# Patient Record
Sex: Male | Born: 1965 | Race: White | Hispanic: No | Marital: Married | Smoking: Never smoker
Health system: Southern US, Community
[De-identification: ages and names within clinical notes are randomized; demographics above are authoritative.]

## PROBLEM LIST (undated history)

## (undated) DIAGNOSIS — E559 Vitamin D deficiency, unspecified: Secondary | ICD-10-CM

## (undated) DIAGNOSIS — R7303 Prediabetes: Secondary | ICD-10-CM

## (undated) DIAGNOSIS — F419 Anxiety disorder, unspecified: Secondary | ICD-10-CM

## (undated) DIAGNOSIS — E785 Hyperlipidemia, unspecified: Secondary | ICD-10-CM

## (undated) DIAGNOSIS — N529 Male erectile dysfunction, unspecified: Secondary | ICD-10-CM

---

## 2013-10-19 ENCOUNTER — Inpatient Hospital Stay: Payer: Self-pay | Admitting: Internal Medicine

## 2013-10-19 LAB — URINALYSIS, COMPLETE
Bacteria: NONE SEEN
Bilirubin,UR: NEGATIVE
Blood: NEGATIVE
GLUCOSE, UR: NEGATIVE mg/dL (ref 0–75)
LEUKOCYTE ESTERASE: NEGATIVE
NITRITE: NEGATIVE
PH: 5 (ref 4.5–8.0)
Protein: NEGATIVE
RBC,UR: <1 /HPF (ref 0–5)
Specific Gravity: 1.012 (ref 1.003–1.030)
Squamous Epithelial: NONE SEEN
WBC UR: NONE SEEN /HPF (ref 0–5)

## 2013-10-19 LAB — TROPONIN I
TROPONIN-I: 1.1 ng/mL — AB
Troponin-I: 2 ng/mL — ABNORMAL HIGH
Troponin-I: 5.5 ng/mL — ABNORMAL HIGH

## 2013-10-19 LAB — CK-MB
CK-MB: 8 ng/mL — ABNORMAL HIGH (ref 0.5–3.6)
CK-MB: 16.4 ng/mL — ABNORMAL HIGH (ref 0.5–3.6)
CK-MB: 40.7 ng/mL — AB (ref 0.5–3.6)

## 2013-10-19 LAB — CBC WITH DIFFERENTIAL/PLATELET
Basophil #: 0 10*3/uL (ref 0.0–0.1)
Basophil %: 0.4 %
Eosinophil #: 0.1 10*3/uL (ref 0.0–0.7)
Eosinophil %: 1.2 %
HCT: 48.9 % (ref 40.0–52.0)
HGB: 16.4 g/dL (ref 13.0–18.0)
Lymphocyte #: 2.8 10*3/uL (ref 1.0–3.6)
Lymphocyte %: 30.3 %
MCH: 29.6 pg (ref 26.0–34.0)
MCHC: 33.6 g/dL (ref 32.0–36.0)
MCV: 88 fL (ref 80–100)
MONO ABS: 1 x10 3/mm (ref 0.2–1.0)
MONOS PCT: 10.4 %
Neutrophil #: 5.4 10*3/uL (ref 1.4–6.5)
Neutrophil %: 57.7 %
Platelet: 233 10*3/uL (ref 150–440)
RBC: 5.55 10*6/uL (ref 4.40–5.90)
RDW: 13.6 % (ref 11.5–14.5)
WBC: 9.3 10*3/uL (ref 3.8–10.6)

## 2013-10-19 LAB — APTT
Activated PTT: 32.6 secs (ref 23.6–35.9)
Activated PTT: 67.7 secs — ABNORMAL HIGH (ref 23.6–35.9)

## 2013-10-19 LAB — BASIC METABOLIC PANEL
Anion Gap: 6 — ABNORMAL LOW (ref 7–16)
BUN: 17 mg/dL (ref 7–18)
CALCIUM: 9.6 mg/dL (ref 8.5–10.1)
Chloride: 106 mmol/L (ref 98–107)
Co2: 26 mmol/L (ref 21–32)
Creatinine: 0.76 mg/dL (ref 0.60–1.30)
EGFR (African American): >60
EGFR (Non-African Amer.): >60
Glucose: 123 mg/dL — ABNORMAL HIGH (ref 65–99)
Osmolality: 279 (ref 275–301)
POTASSIUM: 3.9 mmol/L (ref 3.5–5.1)
Sodium: 138 mmol/L (ref 136–145)

## 2013-10-19 LAB — HEMOGLOBIN A1C: Hemoglobin A1C: 6 % (ref 4.2–6.3)

## 2013-10-19 LAB — LIPID PANEL
Cholesterol: 176 mg/dL (ref 0–200)
HDL Cholesterol: 54 mg/dL (ref 40–60)
Ldl Cholesterol, Calc: 92 mg/dL (ref 0–100)
Triglycerides: 151 mg/dL (ref 0–200)
VLDL Cholesterol, Calc: 30 mg/dL (ref 5–40)

## 2013-10-19 LAB — PROTIME-INR
INR: 0.9
Prothrombin Time: 11.9 secs (ref 11.5–14.7)

## 2013-10-20 ENCOUNTER — Encounter (HOSPITAL_COMMUNITY): Payer: Self-pay | Admitting: Physician Assistant

## 2013-10-20 ENCOUNTER — Inpatient Hospital Stay (HOSPITAL_COMMUNITY)
Admission: AD | Admit: 2013-10-20 | Discharge: 2013-10-27 | DRG: 236 | Disposition: A | Discharge status: Home / Self Care | Payer: 59 | Source: Other Acute Inpatient Hospital | Attending: Surgery | Admitting: Surgery

## 2013-10-20 ENCOUNTER — Other Ambulatory Visit: Payer: Self-pay | Admitting: *Deleted

## 2013-10-20 ENCOUNTER — Inpatient Hospital Stay (HOSPITAL_COMMUNITY): Payer: 59

## 2013-10-20 DIAGNOSIS — Z951 Presence of aortocoronary bypass graft: Secondary | ICD-10-CM

## 2013-10-20 DIAGNOSIS — I2510 Atherosclerotic heart disease of native coronary artery without angina pectoris: Secondary | ICD-10-CM

## 2013-10-20 DIAGNOSIS — D62 Acute posthemorrhagic anemia: Secondary | ICD-10-CM | POA: Diagnosis not present

## 2013-10-20 DIAGNOSIS — R079 Chest pain, unspecified: Secondary | ICD-10-CM

## 2013-10-20 DIAGNOSIS — E8779 Other fluid overload: Secondary | ICD-10-CM | POA: Diagnosis not present

## 2013-10-20 DIAGNOSIS — D696 Thrombocytopenia, unspecified: Secondary | ICD-10-CM | POA: Diagnosis not present

## 2013-10-20 DIAGNOSIS — I214 Non-ST elevation (NSTEMI) myocardial infarction: Principal | ICD-10-CM | POA: Diagnosis present

## 2013-10-20 DIAGNOSIS — E785 Hyperlipidemia, unspecified: Secondary | ICD-10-CM | POA: Diagnosis present

## 2013-10-20 HISTORY — DX: Hyperlipidemia, unspecified: E78.5

## 2013-10-20 LAB — BASIC METABOLIC PANEL
Anion Gap: 6 — ABNORMAL LOW (ref 7–16)
BUN: 11 mg/dL (ref 7–18)
CALCIUM: 8.9 mg/dL (ref 8.5–10.1)
CREATININE: 0.95 mg/dL (ref 0.60–1.30)
Chloride: 107 mmol/L (ref 98–107)
Co2: 24 mmol/L (ref 21–32)
EGFR (Non-African Amer.): >60
Glucose: 102 mg/dL — ABNORMAL HIGH (ref 65–99)
Osmolality: 273 (ref 275–301)
Potassium: 3.7 mmol/L (ref 3.5–5.1)
SODIUM: 137 mmol/L (ref 136–145)

## 2013-10-20 LAB — CBC WITH DIFFERENTIAL/PLATELET
BASOS ABS: 0 10*3/uL (ref 0.0–0.1)
BASOS PCT: 0.4 %
Eosinophil #: 0.1 10*3/uL (ref 0.0–0.7)
Eosinophil %: 0.4 %
HCT: 45 % (ref 40.0–52.0)
HGB: 15.1 g/dL (ref 13.0–18.0)
LYMPHS ABS: 2.8 10*3/uL (ref 1.0–3.6)
LYMPHS PCT: 22.5 %
MCH: 29.3 pg (ref 26.0–34.0)
MCHC: 33.6 g/dL (ref 32.0–36.0)
MCV: 87 fL (ref 80–100)
MONOS PCT: 10.8 %
Monocyte #: 1.3 x10 3/mm — ABNORMAL HIGH (ref 0.2–1.0)
NEUTROS ABS: 8.1 10*3/uL — AB (ref 1.4–6.5)
Neutrophil %: 65.9 %
Platelet: 211 10*3/uL (ref 150–440)
RBC: 5.16 10*6/uL (ref 4.40–5.90)
RDW: 13.4 % (ref 11.5–14.5)
WBC: 12.4 10*3/uL — ABNORMAL HIGH (ref 3.8–10.6)

## 2013-10-20 LAB — MRSA PCR SCREENING: MRSA BY PCR: NEGATIVE

## 2013-10-20 LAB — URINALYSIS, ROUTINE W REFLEX MICROSCOPIC
BILIRUBIN URINE: NEGATIVE
Glucose, UA: NEGATIVE mg/dL
Hgb urine dipstick: NEGATIVE
KETONES UR: NEGATIVE mg/dL
Leukocytes, UA: NEGATIVE
NITRITE: NEGATIVE
Protein, ur: NEGATIVE mg/dL
Specific Gravity, Urine: 1.028 (ref 1.005–1.030)
Urobilinogen, UA: 1 mg/dL (ref 0.0–1.0)
pH: 7 (ref 5.0–8.0)

## 2013-10-20 LAB — LIPID PANEL
CHOL/HDL RATIO: 2.8 ratio
Cholesterol: 162 mg/dL (ref 0–200)
HDL: 57 mg/dL (ref >39)
LDL CALC: 82 mg/dL (ref 0–99)
Triglycerides: 117 mg/dL (ref <150)
VLDL: 23 mg/dL (ref 0–40)

## 2013-10-20 LAB — HEPARIN LEVEL (UNFRACTIONATED): Heparin Unfractionated: 0.44 IU/mL (ref 0.30–0.70)

## 2013-10-20 LAB — APTT: ACTIVATED PTT: 56.9 s — AB (ref 23.6–35.9)

## 2013-10-20 MED ORDER — SODIUM CHLORIDE 0.9 % IV SOLN
INTRAVENOUS | Status: DC
  Administered 2013-10-20: 16:00:00 via INTRAVENOUS

## 2013-10-20 MED ORDER — METOPROLOL TARTRATE 12.5 MG HALF TABLET
12.5000 mg | ORAL_TABLET | Freq: Two times a day (BID) | ORAL | Status: DC
  Administered 2013-10-20 – 2013-10-22 (×5): 12.5 mg via ORAL
  Filled 2013-10-20 (×6): qty 1

## 2013-10-20 MED ORDER — ALPRAZOLAM 0.5 MG PO TABS
0.5000 mg | ORAL_TABLET | Freq: Three times a day (TID) | ORAL | Status: DC | PRN
  Administered 2013-10-21 – 2013-10-22 (×5): 0.5 mg via ORAL
  Filled 2013-10-20 (×5): qty 1

## 2013-10-20 MED ORDER — HEPARIN (PORCINE) IN NACL 100-0.45 UNIT/ML-% IJ SOLN
1550.0000 [IU]/h | INTRAMUSCULAR | Status: DC
  Administered 2013-10-20 – 2013-10-22 (×4): 1550 [IU]/h via INTRAVENOUS
  Filled 2013-10-20 (×8): qty 250

## 2013-10-20 MED ORDER — NITROGLYCERIN 0.4 MG SL SUBL: 0.4000 mg | SUBLINGUAL_TABLET | SUBLINGUAL | Status: DC | PRN

## 2013-10-20 MED ORDER — ASPIRIN 325 MG PO TABS
325.0000 mg | ORAL_TABLET | Freq: Every day | ORAL | Status: DC
  Administered 2013-10-21: 325 mg via ORAL
  Filled 2013-10-20 (×2): qty 1

## 2013-10-20 MED ORDER — MORPHINE SULFATE 2 MG/ML IJ SOLN: 2.0000 mg | INTRAMUSCULAR | Status: DC | PRN

## 2013-10-20 MED ORDER — ATORVASTATIN CALCIUM 20 MG PO TABS
20.0000 mg | ORAL_TABLET | Freq: Every day | ORAL | Status: DC
  Administered 2013-10-20 – 2013-10-26 (×6): 20 mg via ORAL
  Filled 2013-10-20 (×8): qty 1

## 2013-10-20 MED ORDER — ACETAMINOPHEN 325 MG PO TABS
650.0000 mg | ORAL_TABLET | Freq: Four times a day (QID) | ORAL | Status: DC | PRN
  Administered 2013-10-20 – 2013-10-22 (×2): 650 mg via ORAL
  Filled 2013-10-20 (×2): qty 2

## 2013-10-20 MED ORDER — ONDANSETRON HCL 4 MG/2ML IJ SOLN: 4.0000 mg | Freq: Once | INTRAMUSCULAR | Status: DC

## 2013-10-20 NOTE — Progress Notes (Signed)
PA paged to make aware of pt's arrival to unit from [place]; Hep gtt infusing; pt denies pain; VSS; family in room; will cont. To monitor.

## 2013-10-20 NOTE — H&P (Signed)
Patrick Pope       [place] [address]             [phone]        Patrick Pope Medical Record #[id number] Date of Birth: 1966-02-13  Referring: Neoma Laming Primary Care: No PCP Per Patient  Chief Complaint:   NSTEMI, 3V CAD  History of Present Illness:      Patrick Pope is a 48 yo male with no previous history of CAD.  He has history of Hyperlipidemia for which he takes Lipitor.  The patient presented to an [facility] with a 2 complaint of feeling sick.  The patient stated that he just felt sick along his torso.  He originally thought he was suffering from indigestion, however the patient states it was more abdominal discomfort with radiation into his back.  The patient took Ibuprofen and Hydrocodone which was prescribed from a recent oral surgery which originally provided relief.  However on the morning of presentation he woke up and his feelings of discomfort had returned.  During this episode he did have some associated nasuea, but denied palpitations, diaphoresis and shortness of breath.  Hospital workup consisted of EKG with ST depression in lateral leads.  Initial Troponin was elevated at 1.1.  Due to this it was felt he should be admitted for further observation.  He was admitted to the hospitalist service.  His cardiac enzymes continued to rise and Cardiology consult was obtained.  He was later ruled in for acute MI and taken for cardiac catheterization.  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  It was felt Coronary Bypass Grafting would be the treatment of choice.  He was placed on a Heparin drip and transferred to [facility] for bypass surgery.  Currently, the patient is chest pain free.  The patient denies smoking history, but states his mother has significant heart problems in her 21s.  Current Activity/ Functional Status: Patient is independent with mobility/ambulation, transfers, ADL's, IADL's.   Zubrod Score: At the time of surgery this  patient's most appropriate activity status/level should be described as: [x]     0    Normal activity, no symptoms []     1    Restricted in physical strenuous activity but ambulatory, able to do out light work []     2    Ambulatory and capable of self care, unable to do work activities, up and about                 more than 50%  Of the time                            []     3    Only limited self care, in bed greater than 50% of waking hours []     4    Completely disabled, no self care, confined to bed or chair []     5    Moribund    History  Smoking status  . Never Smoker   Smokeless tobacco  . Never Used    History  Alcohol Use  . Yes   History   Social History  . Marital Status: Married    Spouse Name: N/A    Number of Children: N/A  . Years of Education: N/A   Occupational History  . Not on file.   Social History Main Topics  . Smoking status: Never Smoker   .  Smokeless tobacco: Never Used  . Alcohol Use: Yes  . Drug Use: No  . Sexual Activity: Not on file   Other Topics Concern  . Not on file   Social History Narrative  . No narrative on file    Current Facility-Administered Medications  Medication Dose Route Frequency Provider Last Rate Last Dose  . 0.9 %  sodium chloride infusion   Intravenous Continuous Erin Barrett, PA-C      . acetaminophen (TYLENOL) tablet 650 mg  650 mg Oral Q6H PRN Erin Barrett, PA-C      . aspirin tablet 325 mg  325 mg Oral Daily Erin Barrett, PA-C      . atorvastatin (LIPITOR) tablet 20 mg  20 mg Oral q1800 Erin Barrett, PA-C      . metoprolol tartrate (LOPRESSOR) tablet 12.5 mg  12.5 mg Oral BID Erin Barrett, PA-C      . morphine 2 MG/ML injection 2 mg  2 mg Intravenous Q1H PRN Erin Barrett, PA-C      . nitroGLYCERIN (NITROSTAT) SL tablet 0.4 mg  0.4 mg Sublingual Q5 min PRN Erin Barrett, PA-C      . ondansetron (ZOFRAN) injection 4 mg  4 mg Intravenous Once Erin Barrett, PA-C       Review of Systems:     Cardiac Review of  Systems: Y or N  Chest Pain [n    ]  Resting SOB [  n ] Exertional SOB  [  ]  Orthopnea [  ]   Pedal Edema [   ]    Palpitations [  ] Syncope  [  ]   Presyncope [   ]  General Review of Systems: [Y] = yes [  ]=no Constitional: recent weight change [  ]; anorexia [  ]; fatigue [  ]; nausea [ n ]; night sweats [  ]; fever [  ]; or chills [  ]                                                               Dental: poor dentition[  ]; Last Dentist visit:   Eye : blurred vision [  ]; diplopia [   ]; vision changes [  ];  Amaurosis fugax[  ]; Resp: cough [  ];  wheezing[ n ];  hemoptysis[  ]; shortness of breath[ n ]; paroxysmal nocturnal dyspnea[  ]; dyspnea on exertion[ n ]; or orthopnea[  ];  GI:  gallstones[  ], vomiting[  ];  dysphagia[  ]; melena[  ];  hematochezia [  ]; heartburn[n  ];   Hx of  Colonoscopy[  ]; GU: kidney stones [ n ]; hematuria[  ];   dysuria [  ];  nocturia[  ];  history of     obstruction [  ]; urinary frequency [  ]             Skin: rash, swelling[ n ];, hair loss[  ];  peripheral edema[ n ];  or itching[  ]; Musculosketetal: myalgias[  ];  joint swelling[  ];  joint erythema[  ];  joint pain[  ];  back pain[  ];  Heme/Lymph: bruising[  ];  bleeding[  ];  anemia[  ];  Neuro: TIA[  ];  headaches[  ];  stroke[  ];  vertigo[  ];  seizures[  ];   paresthesias[  ];  difficulty walking[  ];  Psych:depression[  ]; anxiety[  ];  Endocrine: diabetes[  n];  thyroid dysfunction[  ];  Immunizations: Flu [  ]; Pneumococcal[  ];  Other:  Physical Exam: BP 120/76  Pulse 70  Temp(Src) 97.9 F (36.6 C) (Oral)  Resp 16  SpO2 95%  General appearance: alert, cooperative and no distress Neurologic: intact Heart: regular rate and rhythm Lungs: clear to auscultation bilaterally Abdomen: soft, non-tender; bowel sounds normal; no masses,  no organomegaly Extremities: extremities normal, atraumatic, no cyanosis or edema   Assessment / Plan:      1. Admit to Inpatient 2. NSTEMI, CAD-  Continue Heparin gtt, ASA, Lopressor 3. Hyperlipidemia- continue Lipitor at home dose 4. Dispo- patient stable, chest pain free, timing of bypass per Dr. Cyndia Bent      @ME1 @ 10/20/2013 3:56 PM   Chart reviewed, cine reviewed in the cloud patient examined, agree with above. He has severe 3-vessel coronary disease with distal left main trifurcation stenosis extending out into the LAD and LCX. There is 95% proximal LAD and diagonal stenosis, 90% mid RCA stenosis. LV function looks good. I agree with the need for CABG. He received 300 mg loading dose of Plavix yesterday at [facility]. Will check his P2Y12 assay in the am and then decide on timing of surgery. He is pain-free on Heparin drip. I discussed the operative procedure with the patient and wife, mother including alternatives, benefits and risks; including but not limited to bleeding, blood transfusion, infection, stroke, myocardial infarction, graft failure, heart block requiring a permanent pacemaker, organ dysfunction, and death.  Patrick Pope understands and agrees to proceed.

## 2013-10-20 NOTE — Progress Notes (Signed)
ANTICOAGULATION CONSULT NOTE - Initial Consult  Pharmacy Consult for heparin Indication: chest pain/ACS  No Known Allergies  Patient Measurements: Height: 5\' 11"  (180.3 cm) Weight: 214 lb (97.07 kg) IBW/kg (Calculated) : 75.3 Heparin Dosing Weight: 97kg  Vital Signs: Temp: 97.9 F (36.6 C) (06/15 1445) Temp src: Oral (06/15 1445) BP: 120/76 mmHg (06/15 1445) Pulse Rate: 70 (06/15 1445)  Labs:  Recent Labs  10/20/13 1950  HEPARINUNFRC 0.44    CrCl is unknown because no creatinine reading has been taken.   Medical History: Past Medical History  Diagnosis Date  . Hyperlipidemia     Medications:  Prescriptions prior to admission  Medication Sig Dispense Refill  . atorvastatin (LIPITOR) 20 MG tablet Take 20 mg by mouth every evening.      Marland Kitchen ibuprofen (ADVIL,MOTRIN) 600 MG tablet Take 600 mg by mouth daily as needed.      . metoprolol tartrate (LOPRESSOR) 25 MG tablet Take 12.5 mg by mouth 2 (two) times daily.       Scheduled:  . aspirin  325 mg Oral Daily  . atorvastatin  20 mg Oral q1800  . metoprolol tartrate  12.5 mg Oral BID  . ondansetron (ZOFRAN) IV  4 mg Intravenous Once   Infusions:  . sodium chloride 10 mL/hr at 10/20/13 1558  . heparin 1,550 Units/hr (10/20/13 1608)    Assessment: Pt was tx from [place] for CP. It looks like he was started on IV heparin over there with a bolus and a rate around 1560 units/hr. He was then taken to cath and found to have 3VD so he was tx over here for OHS eval. Heparin to be cont here. Not sure what time heparin was started over there. Heparin level is therapeutic tonight.   Goal of Therapy:  Heparin level 0.3-0.7 units/ml Monitor platelets by anticoagulation protocol: Yes   Plan:   Cont heparin drip at 1550 units/hr Daily level and CBC

## 2013-10-20 NOTE — Progress Notes (Addendum)
ANTICOAGULATION CONSULT NOTE - Initial Consult  Pharmacy Consult for heparin Indication: chest pain/ACS  Allergies not on file  Patient Measurements:   Heparin Dosing Weight: 97kg  Vital Signs: Temp: 97.9 F (36.6 C) (06/15 1445) Temp src: Oral (06/15 1445) BP: 120/76 mmHg (06/15 1445) Pulse Rate: 70 (06/15 1445)  Labs: No results found for this basename: HGB, HCT, PLT, APTT, LABPROT, INR, HEPARINUNFRC, CREATININE, CKTOTAL, CKMB, TROPONINI,  in the last 72 hours  CrCl is unknown because no creatinine reading has been taken and the patient has no height on file.   Medical History: No past medical history on file.  Medications:  No prescriptions prior to admission   Scheduled:  . aspirin  325 mg Oral Daily  . atorvastatin  20 mg Oral q1800  . metoprolol tartrate  12.5 mg Oral BID  . ondansetron (ZOFRAN) IV  4 mg Intravenous Once   Infusions:    Assessment: Pt was tx from [facility] for CP. It looks like he was started on IV heparin over there with a bolus and a rate around 1560 units/hr. He was then taken to cath and found to have 3VD so he was tx over here for OHS eval. Heparin to be cont here. Not sure what time heparin was started over there. Will check at level this PM at 2000.   Goal of Therapy:  Heparin level 0.3-0.7 units/ml Monitor platelets by anticoagulation protocol: Yes   Plan:   Cont heparin drip at 1550 units/hr F/u with 6 hr heparin level Daily level and CBC

## 2013-10-20 NOTE — Progress Notes (Signed)
Pt given IS and cardiac surgery booklet; pt demonstrated use of IS; will cont. To monitor.

## 2013-10-21 ENCOUNTER — Encounter (HOSPITAL_COMMUNITY): Payer: Self-pay | Admitting: *Deleted

## 2013-10-21 ENCOUNTER — Inpatient Hospital Stay (HOSPITAL_COMMUNITY): Payer: 59

## 2013-10-21 DIAGNOSIS — Z01810 Encounter for preprocedural cardiovascular examination: Secondary | ICD-10-CM

## 2013-10-21 LAB — CBC
HCT: 43.3 % (ref 39.0–52.0)
Hemoglobin: 14.9 g/dL (ref 13.0–17.0)
MCH: 30 pg (ref 26.0–34.0)
MCHC: 34.4 g/dL (ref 30.0–36.0)
MCV: 87.1 fL (ref 78.0–100.0)
PLATELETS: 190 10*3/uL (ref 150–400)
RBC: 4.97 MIL/uL (ref 4.22–5.81)
RDW: 12.9 % (ref 11.5–15.5)
WBC: 8 10*3/uL (ref 4.0–10.5)

## 2013-10-21 LAB — HEPARIN LEVEL (UNFRACTIONATED): HEPARIN UNFRACTIONATED: 0.51 [IU]/mL (ref 0.30–0.70)

## 2013-10-21 LAB — BASIC METABOLIC PANEL
BUN: 10 mg/dL (ref 6–23)
CALCIUM: 9.2 mg/dL (ref 8.4–10.5)
CO2: 22 mEq/L (ref 19–32)
Chloride: 101 mEq/L (ref 96–112)
Creatinine, Ser: 0.78 mg/dL (ref 0.50–1.35)
GFR calc Af Amer: >90 mL/min (ref >90)
Glucose, Bld: 110 mg/dL — ABNORMAL HIGH (ref 70–99)
Potassium: 4.1 mEq/L (ref 3.7–5.3)
SODIUM: 138 meq/L (ref 137–147)

## 2013-10-21 LAB — PLATELET INHIBITION P2Y12: PLATELET FUNCTION P2Y12: 137 [PRU] — AB (ref 194–418)

## 2013-10-21 MED ORDER — ALBUTEROL SULFATE (2.5 MG/3ML) 0.083% IN NEBU: 2.5000 mg | INHALATION_SOLUTION | Freq: Once | RESPIRATORY_TRACT | Status: DC

## 2013-10-21 MED FILL — Heparin Sodium (Porcine) 100 Unt/ML in Sodium Chloride 0.45%: INTRAMUSCULAR | Qty: 250 | Status: AC

## 2013-10-21 NOTE — Progress Notes (Addendum)
      [address]       [place] [address]             [phone]             Subjective: Patient using incentive spirometer. Has not had chest pain. Would like to shower.  Objective: Vital signs in last 24 hours: Temp:  [97.9 F (36.6 C)-98.8 F (37.1 C)] 98.8 F (37.1 C) (06/16 0340) Pulse Rate:  [70-75] 73 (06/16 0340) Cardiac Rhythm:  [-] Normal sinus rhythm (06/15 1925) Resp:  [16-20] 18 (06/16 0340) BP: (112-120)/(70-76) 116/74 mmHg (06/16 0340) SpO2:  [95 %-99 %] 99 % (06/16 0340) Weight:  [212 lb 4.9 oz (96.3 kg)-214 lb (97.07 kg)] 212 lb 4.9 oz (96.3 kg) (06/16 0340)   Current Weight  10/21/13 212 lb 4.9 oz (96.3 kg)      Intake/Output from previous day: 06/15 0701 - 06/16 0700 In: 1075.3 [P.O.:720; I.V.:355.3] Out: 1250 [Urine:1250]   Physical Exam:  Cardiovascular: RRR, no murmurs, gallops, or rubs. Pulmonary: Clear to auscultation bilaterally; no rales, wheezes, or rhonchi. Abdomen: Soft, non tender, bowel sounds present. Extremities: No lower extremity edema.  Lab Results: CBC: Recent Labs  10/21/13 0440  WBC 8.0  HGB 14.9  HCT 43.3  PLT 190   BMET:  Recent Labs  10/21/13 0440  NA 138  K 4.1  CL 101  CO2 22  GLUCOSE 110*  BUN 10  CREATININE 0.78  CALCIUM 9.2    PT/INR:  No results found for this basename: INR, PROTIME   ABG:  INR: Will add last result for INR, ABG once components are confirmed Will add last 4 CBG results once components are confirmed  Assessment/Plan:  1. CV - SR in the 80's. Multivessel CAD. Needs CABG. Continue heparin gttp, BB. 2. Await P2Y12 assay to determine timing of surgery   ZIMMERMAN,DONIELLE MPA-C 10/21/2013,7:45 AM   Chart reviewed, patient examined, agree with above. No chest pain or dyspnea P2Y12 was 137 this am so he has significant platelet inhibition after 300 mg of Plavix. Will plan to do surgery Thursday am.

## 2013-10-21 NOTE — Progress Notes (Signed)
ANTICOAGULATION CONSULT NOTE - Follow Up Consult  Pharmacy Consult for heparin Indication: chest pain/ACS  No Known Allergies  Patient Measurements: Height: 5\' 11"  (180.3 cm) Weight: 212 lb 4.9 oz (96.3 kg) IBW/kg (Calculated) : 75.3  Vital Signs: Temp: 98.8 F (37.1 C) (06/16 0340) Temp src: Oral (06/16 0340) BP: 116/74 mmHg (06/16 0340) Pulse Rate: 73 (06/16 0340)  Labs:  Recent Labs  10/20/13 1950 10/21/13 0440  HGB  --  14.9  HCT  --  43.3  PLT  --  190  HEPARINUNFRC 0.44 0.51  CREATININE  --  0.78    Estimated Creatinine Clearance: 132.2 ml/min (by C-G formula based on Cr of 0.78).   Medications:  Scheduled:  . aspirin  325 mg Oral Daily  . atorvastatin  20 mg Oral q1800  . metoprolol tartrate  12.5 mg Oral BID  . ondansetron (ZOFRAN) IV  4 mg Intravenous Once   Infusions:  . sodium chloride 10 mL/hr at 10/20/13 1558  . heparin 1,550 Units/hr (10/21/13 0547)    Assessment: 48 yo male from [place] and with 3V CAD on heparin and awaiting CABG. Heparin is at 1500 units/hr and noted at goal (HL= 0.51).  Goal of Therapy:  Heparin level 0.3-0.7 units/ml Monitor platelets by anticoagulation protocol: Yes   Plan:   -No heparin changes needed -Daily heparin level and CBC -Will follow CABG plans  Hildred Laser, Pharm D 10/21/2013 9:28 AM

## 2013-10-21 NOTE — Progress Notes (Addendum)
Phase I Cardiac Rehab 1310-1350 Pt pre OHS education provided including progressive ambulation post op, sternal precautions and practice getting OOB without using arms, importance of IS use with cough and deep breathing.  Pt instructed to watch OHS pre op video #112.  Pt has OHS booklet in room.  Pt has multiple questions about what to expect during the post operative period.  Pt also has some anxiety r/t new illness and upcoming surgery recovery period however he demonstrates appropriate coping mechanisms and positive outlook.  Pt questions answered, pt offered emotional support and reassurance.  Pt verbalized understanding of pre op teaching.

## 2013-10-21 NOTE — Progress Notes (Signed)
VASCULAR LAB PRELIMINARY  PRELIMINARY  PRELIMINARY  PRELIMINARY  Pre-op Cardiac Surgery  Carotid Findings:  Bilateral:  1-39% ICA stenosis.  Vertebral artery flow is antegrade.      Upper Extremity Right Left  Brachial Pressures 117 triphasic 120 triphasic  Radial Waveforms triphasic triphasic  Ulnar Waveforms triphasic triphasic  Palmar Arch (Allen's Test) * **   Findings:  *Right:  Doppler waveforms decrease greater than 50% with ulnar and remain normal with radial compressions.  **Left:  Doppler waveforms remain normal with ulnar and radial compressions.    Lower  Extremity Right Left  Dorsalis Pedis    Anterior Tibial    Posterior Tibial    Ankle/Brachial Indices      Findings:  Palpable pedal pulses x 4.   Patrick Pope, RVT 10/21/2013, 3:33 PM

## 2013-10-22 LAB — CBC
HCT: 43.3 % (ref 39.0–52.0)
HEMOGLOBIN: 14.9 g/dL (ref 13.0–17.0)
MCH: 29.9 pg (ref 26.0–34.0)
MCHC: 34.4 g/dL (ref 30.0–36.0)
MCV: 86.9 fL (ref 78.0–100.0)
Platelets: 201 10*3/uL (ref 150–400)
RBC: 4.98 MIL/uL (ref 4.22–5.81)
RDW: 13 % (ref 11.5–15.5)
WBC: 8.5 10*3/uL (ref 4.0–10.5)

## 2013-10-22 LAB — TYPE AND SCREEN
ABO/RH(D): A POS
ANTIBODY SCREEN: NEGATIVE

## 2013-10-22 LAB — HEPARIN LEVEL (UNFRACTIONATED): HEPARIN UNFRACTIONATED: 0.44 [IU]/mL (ref 0.30–0.70)

## 2013-10-22 LAB — ABO/RH: ABO/RH(D): A POS

## 2013-10-22 MED ORDER — INSULIN REGULAR HUMAN 100 UNIT/ML IJ SOLN
INTRAMUSCULAR | Status: AC
  Administered 2013-10-23: 1 [IU]/h via INTRAVENOUS
  Filled 2013-10-22: qty 1

## 2013-10-22 MED ORDER — DIAZEPAM 5 MG PO TABS
5.0000 mg | ORAL_TABLET | Freq: Once | ORAL | Status: AC
  Administered 2013-10-23: 5 mg via ORAL
  Filled 2013-10-22: qty 1

## 2013-10-22 MED ORDER — PHENYLEPHRINE HCL 10 MG/ML IJ SOLN
30.0000 ug/min | INTRAVENOUS | Status: AC
  Administered 2013-10-23: 20 ug/min via INTRAVENOUS
  Filled 2013-10-22: qty 2

## 2013-10-22 MED ORDER — POTASSIUM CHLORIDE 2 MEQ/ML IV SOLN
80.0000 meq | INTRAVENOUS | Status: DC
  Filled 2013-10-22: qty 40

## 2013-10-22 MED ORDER — SODIUM CHLORIDE 0.9 % IV SOLN
INTRAVENOUS | Status: AC
  Administered 2013-10-23: 69.8 mL/h via INTRAVENOUS
  Filled 2013-10-22: qty 40

## 2013-10-22 MED ORDER — BISACODYL 5 MG PO TBEC: 5.0000 mg | DELAYED_RELEASE_TABLET | Freq: Once | ORAL | Status: DC

## 2013-10-22 MED ORDER — PAPAVERINE HCL 30 MG/ML IJ SOLN
INTRAMUSCULAR | Status: AC
  Administered 2013-10-23: 07:00:00
  Filled 2013-10-22: qty 2.5

## 2013-10-22 MED ORDER — CHLORHEXIDINE GLUCONATE CLOTH 2 % EX PADS
6.0000 | MEDICATED_PAD | Freq: Once | CUTANEOUS | Status: AC
Start: 2013-10-22 — End: 2013-10-22
  Administered 2013-10-22: 6 via TOPICAL

## 2013-10-22 MED ORDER — SODIUM CHLORIDE 0.9 % IV SOLN
INTRAVENOUS | Status: DC
  Filled 2013-10-22: qty 30

## 2013-10-22 MED ORDER — DEXMEDETOMIDINE HCL IN NACL 400 MCG/100ML IV SOLN
0.1000 ug/kg/h | INTRAVENOUS | Status: AC
  Administered 2013-10-23: 0.3 ug/kg/h via INTRAVENOUS
  Filled 2013-10-22: qty 100

## 2013-10-22 MED ORDER — DEXTROSE 5 % IV SOLN
1.5000 g | INTRAVENOUS | Status: AC
  Administered 2013-10-23: 750 mg via INTRAVENOUS
  Administered 2013-10-23: 1500 mg via INTRAVENOUS
  Filled 2013-10-22 (×2): qty 1.5

## 2013-10-22 MED ORDER — MAGNESIUM SULFATE 50 % IJ SOLN
40.0000 meq | INTRAMUSCULAR | Status: DC
  Filled 2013-10-22: qty 10

## 2013-10-22 MED ORDER — METOPROLOL TARTRATE 12.5 MG HALF TABLET
12.5000 mg | ORAL_TABLET | Freq: Once | ORAL | Status: AC
  Administered 2013-10-23: 12.5 mg via ORAL
  Filled 2013-10-22: qty 1

## 2013-10-22 MED ORDER — EPINEPHRINE HCL 1 MG/ML IJ SOLN
0.5000 ug/min | INTRAVENOUS | Status: DC
  Filled 2013-10-22: qty 4

## 2013-10-22 MED ORDER — DEXTROSE 5 % IV SOLN
750.0000 mg | INTRAVENOUS | Status: DC
  Filled 2013-10-22: qty 750

## 2013-10-22 MED ORDER — DOPAMINE-DEXTROSE 3.2-5 MG/ML-% IV SOLN
2.0000 ug/kg/min | INTRAVENOUS | Status: DC
  Filled 2013-10-22: qty 250

## 2013-10-22 MED ORDER — SODIUM CHLORIDE 0.9 % IV SOLN
1250.0000 mg | INTRAVENOUS | Status: AC
  Administered 2013-10-23: 1250 mg via INTRAVENOUS
  Filled 2013-10-22 (×2): qty 1250

## 2013-10-22 MED ORDER — NITROGLYCERIN IN D5W 200-5 MCG/ML-% IV SOLN
2.0000 ug/min | INTRAVENOUS | Status: AC
  Administered 2013-10-23: 5 ug/min via INTRAVENOUS
  Filled 2013-10-22: qty 250

## 2013-10-22 NOTE — Progress Notes (Signed)
Procedure(s) (LRB): CORONARY ARTERY BYPASS GRAFTING (CABG) (N/A) INTRAOPERATIVE TRANSESOPHAGEAL ECHOCARDIOGRAM (N/A) Subjective: No chest pain or shortness of breath  Objective: Vital signs in last 24 hours: Temp:  [98.2 F (36.8 C)-99 F (37.2 C)] 98.2 F (36.8 C) (06/17 0503) Pulse Rate:  [63-64] 63 (06/17 0503) Cardiac Rhythm:  [-]  Resp:  [18-20] 20 (06/17 0503) BP: (107-113)/(65-73) 107/65 mmHg (06/17 0503) SpO2:  [97 %-99 %] 99 % (06/17 0503) Weight:  [96.163 kg (212 lb)] 96.163 kg (212 lb) (06/17 0503)  Hemodynamic parameters for last 24 hours:    Intake/Output from previous day: 06/16 0701 - 06/17 0700 In: 1266 [P.O.:1080; I.V.:186] Out: -  Intake/Output this shift:    General appearance: alert and cooperative Heart: regular rate and rhythm, S1, S2 normal, no murmur, click, rub or gallop Lungs: clear to auscultation bilaterally  Lab Results:  Recent Labs  10/21/13 0440 10/22/13 0650  WBC 8.0 8.5  HGB 14.9 14.9  HCT 43.3 43.3  PLT 190 201   BMET:  Recent Labs  10/21/13 0440  NA 138  K 4.1  CL 101  CO2 22  GLUCOSE 110*  BUN 10  CREATININE 0.78  CALCIUM 9.2    PT/INR: No results found for this basename: LABPROT, INR,  in the last 72 hours ABG No results found for this basename: phart, pco2, po2, hco3, tco2, acidbasedef, o2sat   CBG (last 3)  No results found for this basename: GLUCAP,  in the last 72 hours  Assessment/Plan:  Severe multi-vessel coronary artery disease. Plan CABG tomorrow am. Patient has no further questions. DC heparin on call to OR.   LOS: 2 days    Gilford Raid K 10/22/2013

## 2013-10-22 NOTE — Progress Notes (Signed)
Pt given PO Xanax at this time per pt request; will cont. To monitor. 

## 2013-10-22 NOTE — Progress Notes (Signed)
ANTICOAGULATION CONSULT NOTE - Follow Up Consult  Pharmacy Consult for heparin Indication: chest pain/ACS  No Known Allergies  Patient Measurements: Height: 5\' 11"  (180.3 cm) Weight: 212 lb (96.163 kg) IBW/kg (Calculated) : 75.3  Vital Signs: Temp: 98.2 F (36.8 C) (06/17 0503) Temp src: Oral (06/17 0503) BP: 107/65 mmHg (06/17 0503) Pulse Rate: 63 (06/17 0503)  Labs:  Recent Labs  10/20/13 1950 10/21/13 0440 10/22/13 0650  HGB  --  14.9 14.9  HCT  --  43.3 43.3  PLT  --  190 201  HEPARINUNFRC 0.44 0.51 0.44  CREATININE  --  0.78  --     Estimated Creatinine Clearance: 132.2 ml/min (by C-G formula based on Cr of 0.78).   Medications:  Scheduled:  . albuterol  2.5 mg Nebulization Once  . [START ON 10/23/2013] aminocaproic acid (AMICAR) for OHS   Intravenous To OR  . atorvastatin  20 mg Oral q1800  . [START ON 10/23/2013] cefUROXime (ZINACEF)  IV  1.5 g Intravenous To OR  . [START ON 10/23/2013] cefUROXime (ZINACEF)  IV  750 mg Intravenous To OR  . [START ON 10/23/2013] dexmedetomidine  0.1-0.7 mcg/kg/hr Intravenous To OR  . [START ON 10/23/2013] DOPamine  2-20 mcg/kg/min Intravenous To OR  . [START ON 10/23/2013] epinephrine  0.5-20 mcg/min Intravenous To OR  . [START ON 10/23/2013] heparin-papaverine-plasmalyte irrigation   Irrigation To OR  . [START ON 10/23/2013] heparin 30,000 units/NS 1000 mL solution for CELLSAVER   Other To OR  . [START ON 10/23/2013] insulin (NOVOLIN-R) infusion   Intravenous To OR  . [START ON 10/23/2013] magnesium sulfate  40 mEq Other To OR  . metoprolol tartrate  12.5 mg Oral BID  . [START ON 10/23/2013] nitroGLYCERIN  2-200 mcg/min Intravenous To OR  . ondansetron (ZOFRAN) IV  4 mg Intravenous Once  . [START ON 10/23/2013] phenylephrine (NEO-SYNEPHRINE) Adult infusion  30-200 mcg/min Intravenous To OR  . [START ON 10/23/2013] potassium chloride  80 mEq Other To OR  . [START ON 10/23/2013] vancomycin  1,250 mg Intravenous To OR   Infusions:  .  sodium chloride 10 mL/hr at 10/20/13 1558  . heparin 1,550 Units/hr (10/21/13 2044)    Assessment: 48 yo male from [place] and with 3V CAD on heparin and awaiting CABG. Heparin is at 1500 units/hr and noted at goal (HL= 0.44).  Goal of Therapy:  Heparin level 0.3-0.7 units/ml Monitor platelets by anticoagulation protocol: Yes   Plan:   -No heparin changes needed -Daily heparin level and CBC -Patient noted for CABG in am  Hildred Laser, Pharm D 10/22/2013 9:16 AM

## 2013-10-22 NOTE — Progress Notes (Signed)
CARDIAC REHAB PHASE I   PRE:  Rate/Rhythm: 86 SR    BP: sitting 110/60    SaO2:   MODE:  Ambulation: 1500 ft   POST:  Rate/Rhythm: 80 SR    BP: sitting 117/79     SaO2: 98 RA  Tolerated well, no c/o. Denied CP. Sts he hurt his back Saturday and it feels good to walk. Answered questions. [id number]   Patrick Pope [place] CES, ACSM 10/22/2013 3:49 PM

## 2013-10-23 ENCOUNTER — Encounter (HOSPITAL_COMMUNITY)
Admission: AD | Disposition: A | Discharge status: Home / Self Care | Payer: 59 | Source: Other Acute Inpatient Hospital | Attending: Surgery

## 2013-10-23 ENCOUNTER — Inpatient Hospital Stay (HOSPITAL_COMMUNITY): Payer: 59 | Admitting: Certified Registered Nurse Anesthetist

## 2013-10-23 ENCOUNTER — Inpatient Hospital Stay (HOSPITAL_COMMUNITY): Payer: 59

## 2013-10-23 ENCOUNTER — Encounter (HOSPITAL_COMMUNITY): Payer: 59 | Admitting: Certified Registered Nurse Anesthetist

## 2013-10-23 ENCOUNTER — Encounter (HOSPITAL_COMMUNITY): Payer: Self-pay | Admitting: Certified Registered Nurse Anesthetist

## 2013-10-23 HISTORY — PX: INTRAOPERATIVE TRANSESOPHAGEAL ECHOCARDIOGRAM: SHX5062

## 2013-10-23 HISTORY — PX: CORONARY ARTERY BYPASS GRAFT: SHX141

## 2013-10-23 LAB — BLOOD GAS, ARTERIAL
ACID-BASE EXCESS: 0.5 mmol/L (ref 0.0–2.0)
BICARBONATE: 24.6 meq/L — AB (ref 20.0–24.0)
Drawn by: 39898
FIO2: 0.21 %
O2 SAT: 96.5 %
PO2 ART: 75.2 mmHg — AB (ref 80.0–100.0)
Patient temperature: 98.6
TCO2: 25.8 mmol/L (ref 0–100)
pCO2 arterial: 39.2 mmHg (ref 35.0–45.0)
pH, Arterial: 7.414 (ref 7.350–7.450)

## 2013-10-23 LAB — BASIC METABOLIC PANEL
BUN: 17 mg/dL (ref 6–23)
CO2: 26 mEq/L (ref 19–32)
Calcium: 10.2 mg/dL (ref 8.4–10.5)
Chloride: 99 mEq/L (ref 96–112)
Creatinine, Ser: 0.94 mg/dL (ref 0.50–1.35)
Glucose, Bld: 120 mg/dL — ABNORMAL HIGH (ref 70–99)
POTASSIUM: 4 meq/L (ref 3.7–5.3)
SODIUM: 140 meq/L (ref 137–147)

## 2013-10-23 LAB — POCT I-STAT 3, ART BLOOD GAS (G3+)
ACID-BASE DEFICIT: 3 mmol/L — AB (ref 0.0–2.0)
ACID-BASE DEFICIT: 3 mmol/L — AB (ref 0.0–2.0)
Acid-base deficit: 2 mmol/L (ref 0.0–2.0)
Acid-base deficit: 4 mmol/L — ABNORMAL HIGH (ref 0.0–2.0)
BICARBONATE: 24.9 meq/L — AB (ref 20.0–24.0)
BICARBONATE: 21.8 meq/L (ref 20.0–24.0)
BICARBONATE: 22.6 meq/L (ref 20.0–24.0)
Bicarbonate: 23.1 mEq/L (ref 20.0–24.0)
Bicarbonate: 21.6 mEq/L (ref 20.0–24.0)
O2 SAT: 98 %
O2 Saturation: 100 %
O2 Saturation: 94 %
O2 Saturation: 99 %
O2 Saturation: 99 %
PCO2 ART: 42.2 mmHg (ref 35.0–45.0)
PCO2 ART: 38.6 mmHg (ref 35.0–45.0)
PH ART: 7.359 (ref 7.350–7.450)
PH ART: 7.375 (ref 7.350–7.450)
PO2 ART: 316 mmHg — AB (ref 80.0–100.0)
PO2 ART: 71 mmHg — AB (ref 80.0–100.0)
PO2 ART: 119 mmHg — AB (ref 80.0–100.0)
Patient temperature: 36.1
Patient temperature: 37.2
Patient temperature: 37.3
TCO2: 26 mmol/L (ref 0–100)
TCO2: 24 mmol/L (ref 0–100)
TCO2: 23 mmol/L (ref 0–100)
TCO2: 24 mmol/L (ref 0–100)
TCO2: 23 mmol/L (ref 0–100)
pCO2 arterial: 42.5 mmHg (ref 35.0–45.0)
pCO2 arterial: 38.8 mmHg (ref 35.0–45.0)
pCO2 arterial: 38.9 mmHg (ref 35.0–45.0)
pH, Arterial: 7.379 (ref 7.350–7.450)
pH, Arterial: 7.34 — ABNORMAL LOW (ref 7.350–7.450)
pH, Arterial: 7.354 (ref 7.350–7.450)
pO2, Arterial: 130 mmHg — ABNORMAL HIGH (ref 80.0–100.0)
pO2, Arterial: 133 mmHg — ABNORMAL HIGH (ref 80.0–100.0)

## 2013-10-23 LAB — POCT I-STAT, CHEM 8
BUN: 14 mg/dL (ref 6–23)
BUN: 12 mg/dL (ref 6–23)
CALCIUM ION: 1.13 mmol/L (ref 1.12–1.23)
CALCIUM ION: 1.16 mmol/L (ref 1.12–1.23)
CHLORIDE: 96 meq/L (ref 96–112)
CREATININE: 0.8 mg/dL (ref 0.50–1.35)
Chloride: 106 mEq/L (ref 96–112)
Creatinine, Ser: 0.7 mg/dL (ref 0.50–1.35)
GLUCOSE: 122 mg/dL — AB (ref 70–99)
Glucose, Bld: 123 mg/dL — ABNORMAL HIGH (ref 70–99)
HCT: 32 % — ABNORMAL LOW (ref 39.0–52.0)
HCT: 40 % (ref 39.0–52.0)
Hemoglobin: 10.9 g/dL — ABNORMAL LOW (ref 13.0–17.0)
Hemoglobin: 13.6 g/dL (ref 13.0–17.0)
Potassium: 5.3 mEq/L (ref 3.7–5.3)
Potassium: 4.2 mEq/L (ref 3.7–5.3)
Sodium: 134 mEq/L — ABNORMAL LOW (ref 137–147)
Sodium: 138 mEq/L (ref 137–147)
TCO2: 23 mmol/L (ref 0–100)
TCO2: 22 mmol/L (ref 0–100)

## 2013-10-23 LAB — POCT I-STAT 4, (NA,K, GLUC, HGB,HCT)
GLUCOSE: 140 mg/dL — AB (ref 70–99)
Glucose, Bld: 108 mg/dL — ABNORMAL HIGH (ref 70–99)
Glucose, Bld: 123 mg/dL — ABNORMAL HIGH (ref 70–99)
Glucose, Bld: 136 mg/dL — ABNORMAL HIGH (ref 70–99)
Glucose, Bld: 123 mg/dL — ABNORMAL HIGH (ref 70–99)
HCT: 42 % (ref 39.0–52.0)
HCT: 32 % — ABNORMAL LOW (ref 39.0–52.0)
HCT: 40 % (ref 39.0–52.0)
HCT: 36 % — ABNORMAL LOW (ref 39.0–52.0)
HEMATOCRIT: 31 % — AB (ref 39.0–52.0)
HEMOGLOBIN: 13.6 g/dL (ref 13.0–17.0)
HEMOGLOBIN: 12.2 g/dL — AB (ref 13.0–17.0)
Hemoglobin: 14.3 g/dL (ref 13.0–17.0)
Hemoglobin: 10.9 g/dL — ABNORMAL LOW (ref 13.0–17.0)
Hemoglobin: 10.5 g/dL — ABNORMAL LOW (ref 13.0–17.0)
POTASSIUM: 4.3 meq/L (ref 3.7–5.3)
POTASSIUM: 4.1 meq/L (ref 3.7–5.3)
Potassium: 3.8 mEq/L (ref 3.7–5.3)
Potassium: 3.9 mEq/L (ref 3.7–5.3)
Potassium: 5 mEq/L (ref 3.7–5.3)
SODIUM: 134 meq/L — AB (ref 137–147)
SODIUM: 136 meq/L — AB (ref 137–147)
SODIUM: 131 meq/L — AB (ref 137–147)
SODIUM: 137 meq/L (ref 137–147)
Sodium: 140 mEq/L (ref 137–147)

## 2013-10-23 LAB — HEMOGLOBIN A1C
Hgb A1c MFr Bld: 5.8 % — ABNORMAL HIGH (ref <5.7)
MEAN PLASMA GLUCOSE: 120 mg/dL — AB (ref <117)

## 2013-10-23 LAB — CREATININE, SERUM
Creatinine, Ser: 0.74 mg/dL (ref 0.50–1.35)
GFR calc Af Amer: >90 mL/min (ref >90)
GFR calc non Af Amer: >90 mL/min (ref >90)

## 2013-10-23 LAB — CBC
HCT: 44.8 % (ref 39.0–52.0)
HCT: 36.8 % — ABNORMAL LOW (ref 39.0–52.0)
HCT: 37.7 % — ABNORMAL LOW (ref 39.0–52.0)
Hemoglobin: 15.2 g/dL (ref 13.0–17.0)
Hemoglobin: 12.6 g/dL — ABNORMAL LOW (ref 13.0–17.0)
Hemoglobin: 13.2 g/dL (ref 13.0–17.0)
MCH: 29.6 pg (ref 26.0–34.0)
MCH: 29.9 pg (ref 26.0–34.0)
MCH: 29.9 pg (ref 26.0–34.0)
MCHC: 33.9 g/dL (ref 30.0–36.0)
MCHC: 34.2 g/dL (ref 30.0–36.0)
MCHC: 35 g/dL (ref 30.0–36.0)
MCV: 87.2 fL (ref 78.0–100.0)
MCV: 87.4 fL (ref 78.0–100.0)
MCV: 85.3 fL (ref 78.0–100.0)
Platelets: 208 10*3/uL (ref 150–400)
Platelets: 123 10*3/uL — ABNORMAL LOW (ref 150–400)
Platelets: 183 10*3/uL (ref 150–400)
RBC: 5.14 MIL/uL (ref 4.22–5.81)
RBC: 4.21 MIL/uL — ABNORMAL LOW (ref 4.22–5.81)
RBC: 4.42 MIL/uL (ref 4.22–5.81)
RDW: 13 % (ref 11.5–15.5)
RDW: 12.7 % (ref 11.5–15.5)
RDW: 12.7 % (ref 11.5–15.5)
WBC: 10 10*3/uL (ref 4.0–10.5)
WBC: 10.7 10*3/uL — AB (ref 4.0–10.5)
WBC: 16.1 10*3/uL — ABNORMAL HIGH (ref 4.0–10.5)

## 2013-10-23 LAB — MAGNESIUM: Magnesium: 3 mg/dL — ABNORMAL HIGH (ref 1.5–2.5)

## 2013-10-23 LAB — HEMOGLOBIN AND HEMATOCRIT, BLOOD
HEMATOCRIT: 32.4 % — AB (ref 39.0–52.0)
Hemoglobin: 11.2 g/dL — ABNORMAL LOW (ref 13.0–17.0)

## 2013-10-23 LAB — GLUCOSE, CAPILLARY: GLUCOSE-CAPILLARY: 131 mg/dL — AB (ref 70–99)

## 2013-10-23 LAB — HEPARIN LEVEL (UNFRACTIONATED): HEPARIN UNFRACTIONATED: 0.42 [IU]/mL (ref 0.30–0.70)

## 2013-10-23 LAB — PROTIME-INR
INR: 0.97 (ref 0.00–1.49)
INR: 1.29 (ref 0.00–1.49)
PROTHROMBIN TIME: 15.8 s — AB (ref 11.6–15.2)
Prothrombin Time: 12.7 seconds (ref 11.6–15.2)

## 2013-10-23 LAB — PLATELET COUNT: Platelets: 136 10*3/uL — ABNORMAL LOW (ref 150–400)

## 2013-10-23 LAB — APTT: APTT: 35 s (ref 24–37)

## 2013-10-23 SURGERY — CORONARY ARTERY BYPASS GRAFTING (CABG)
Anesthesia: General | Site: Chest

## 2013-10-23 MED ORDER — ACETAMINOPHEN 160 MG/5ML PO SOLN
1000.0000 mg | Freq: Four times a day (QID) | ORAL | Status: DC
  Filled 2013-10-23: qty 40

## 2013-10-23 MED ORDER — SODIUM CHLORIDE 0.9 % IJ SOLN
3.0000 mL | Freq: Two times a day (BID) | INTRAMUSCULAR | Status: DC
  Administered 2013-10-24: 3 mL via INTRAVENOUS

## 2013-10-23 MED ORDER — SODIUM CHLORIDE 0.9 % IJ SOLN: 3.0000 mL | INTRAMUSCULAR | Status: DC | PRN

## 2013-10-23 MED ORDER — ACETAMINOPHEN 160 MG/5ML PO SOLN: 650.0000 mg | Freq: Once | ORAL | Status: AC

## 2013-10-23 MED ORDER — BISACODYL 5 MG PO TBEC
10.0000 mg | DELAYED_RELEASE_TABLET | Freq: Every day | ORAL | Status: DC
  Administered 2013-10-24: 10 mg via ORAL
  Filled 2013-10-23: qty 2

## 2013-10-23 MED ORDER — ALBUMIN HUMAN 5 % IV SOLN
INTRAVENOUS | Status: DC | PRN
  Administered 2013-10-23: 13:00:00 via INTRAVENOUS

## 2013-10-23 MED ORDER — ROCURONIUM BROMIDE 100 MG/10ML IV SOLN
INTRAVENOUS | Status: DC | PRN
  Administered 2013-10-23 (×4): 50 mg via INTRAVENOUS
  Administered 2013-10-23: 100 mg via INTRAVENOUS

## 2013-10-23 MED ORDER — FENTANYL CITRATE 0.05 MG/ML IJ SOLN
INTRAMUSCULAR | Status: AC
  Filled 2013-10-23: qty 5

## 2013-10-23 MED ORDER — PHENYLEPHRINE HCL 10 MG/ML IJ SOLN
10.0000 mg | INTRAVENOUS | Status: DC | PRN
  Administered 2013-10-23: 20 ug/min via INTRAVENOUS

## 2013-10-23 MED ORDER — ARTIFICIAL TEARS OP OINT
TOPICAL_OINTMENT | OPHTHALMIC | Status: AC
  Filled 2013-10-23: qty 3.5

## 2013-10-23 MED ORDER — ASPIRIN EC 325 MG PO TBEC
325.0000 mg | DELAYED_RELEASE_TABLET | Freq: Every day | ORAL | Status: DC
  Administered 2013-10-24: 325 mg via ORAL
  Filled 2013-10-23: qty 1

## 2013-10-23 MED ORDER — PHENYLEPHRINE HCL 10 MG/ML IJ SOLN
INTRAMUSCULAR | Status: DC | PRN
  Administered 2013-10-23 (×2): 40 ug via INTRAVENOUS

## 2013-10-23 MED ORDER — PANTOPRAZOLE SODIUM 40 MG PO TBEC: 40.0000 mg | DELAYED_RELEASE_TABLET | Freq: Every day | ORAL | Status: DC

## 2013-10-23 MED ORDER — METOPROLOL TARTRATE 25 MG/10 ML ORAL SUSPENSION
12.5000 mg | Freq: Two times a day (BID) | ORAL | Status: DC
  Filled 2013-10-23 (×3): qty 5

## 2013-10-23 MED ORDER — OXYCODONE HCL 5 MG PO TABS
5.0000 mg | ORAL_TABLET | ORAL | Status: DC | PRN
  Administered 2013-10-23 – 2013-10-24 (×4): 10 mg via ORAL
  Filled 2013-10-23 (×4): qty 2

## 2013-10-23 MED ORDER — PHENYLEPHRINE HCL 10 MG/ML IJ SOLN
0.0000 ug/min | INTRAMUSCULAR | Status: DC
  Filled 2013-10-23: qty 2

## 2013-10-23 MED ORDER — HEPARIN SODIUM (PORCINE) 1000 UNIT/ML IJ SOLN
INTRAMUSCULAR | Status: DC | PRN
  Administered 2013-10-23: 24000 [IU] via INTRAVENOUS

## 2013-10-23 MED ORDER — MIDAZOLAM HCL 10 MG/2ML IJ SOLN
INTRAMUSCULAR | Status: AC
  Filled 2013-10-23: qty 2

## 2013-10-23 MED ORDER — SUCCINYLCHOLINE CHLORIDE 20 MG/ML IJ SOLN
INTRAMUSCULAR | Status: AC
  Filled 2013-10-23: qty 1

## 2013-10-23 MED ORDER — METOPROLOL TARTRATE 12.5 MG HALF TABLET
12.5000 mg | ORAL_TABLET | Freq: Two times a day (BID) | ORAL | Status: DC
  Administered 2013-10-24: 12.5 mg via ORAL
  Filled 2013-10-23 (×3): qty 1

## 2013-10-23 MED ORDER — PHENYLEPHRINE 40 MCG/ML (10ML) SYRINGE FOR IV PUSH (FOR BLOOD PRESSURE SUPPORT)
PREFILLED_SYRINGE | INTRAVENOUS | Status: AC
  Filled 2013-10-23: qty 10

## 2013-10-23 MED ORDER — 0.9 % SODIUM CHLORIDE (POUR BTL) OPTIME
TOPICAL | Status: DC | PRN
  Administered 2013-10-23: 1000 mL

## 2013-10-23 MED ORDER — BISACODYL 10 MG RE SUPP: 10.0000 mg | Freq: Every day | RECTAL | Status: DC

## 2013-10-23 MED ORDER — LIDOCAINE HCL (CARDIAC) 20 MG/ML IV SOLN
INTRAVENOUS | Status: AC
  Filled 2013-10-23: qty 5

## 2013-10-23 MED ORDER — LACTATED RINGERS IV SOLN
INTRAVENOUS | Status: DC | PRN
  Administered 2013-10-23 (×2): via INTRAVENOUS

## 2013-10-23 MED ORDER — MAGNESIUM SULFATE 4000MG/100ML IJ SOLN
4.0000 g | Freq: Once | INTRAMUSCULAR | Status: AC
  Administered 2013-10-23: 4 g via INTRAVENOUS
  Filled 2013-10-23: qty 100

## 2013-10-23 MED ORDER — PROPOFOL 10 MG/ML IV BOLUS
INTRAVENOUS | Status: DC | PRN
  Administered 2013-10-23: 70 mg via INTRAVENOUS
  Administered 2013-10-23: 30 mg via INTRAVENOUS

## 2013-10-23 MED ORDER — LACTATED RINGERS IV SOLN: 500.0000 mL | Freq: Once | INTRAVENOUS | Status: AC | PRN

## 2013-10-23 MED ORDER — ACETAMINOPHEN 650 MG RE SUPP
650.0000 mg | Freq: Once | RECTAL | Status: AC
  Administered 2013-10-23: 650 mg via RECTAL

## 2013-10-23 MED ORDER — MIDAZOLAM HCL 2 MG/2ML IJ SOLN: 2.0000 mg | INTRAMUSCULAR | Status: DC | PRN

## 2013-10-23 MED ORDER — INSULIN REGULAR BOLUS VIA INFUSION
0.0000 [IU] | Freq: Three times a day (TID) | INTRAVENOUS | Status: DC
  Filled 2013-10-23: qty 10

## 2013-10-23 MED ORDER — FENTANYL CITRATE 0.05 MG/ML IJ SOLN
INTRAMUSCULAR | Status: DC | PRN
  Administered 2013-10-23: 150 ug via INTRAVENOUS
  Administered 2013-10-23: 200 ug via INTRAVENOUS
  Administered 2013-10-23: 150 ug via INTRAVENOUS
  Administered 2013-10-23 (×4): 100 ug via INTRAVENOUS
  Administered 2013-10-23: 50 ug via INTRAVENOUS
  Administered 2013-10-23 (×2): 150 ug via INTRAVENOUS

## 2013-10-23 MED ORDER — LACTATED RINGERS IV SOLN
INTRAVENOUS | Status: DC | PRN
  Administered 2013-10-23 (×2): via INTRAVENOUS

## 2013-10-23 MED ORDER — FAMOTIDINE IN NACL 20-0.9 MG/50ML-% IV SOLN: 20.0000 mg | Freq: Two times a day (BID) | INTRAVENOUS | Status: DC

## 2013-10-23 MED ORDER — PROPOFOL 10 MG/ML IV BOLUS
INTRAVENOUS | Status: AC
  Filled 2013-10-23: qty 20

## 2013-10-23 MED ORDER — THROMBIN 20000 UNITS EX SOLR
CUTANEOUS | Status: DC | PRN
  Administered 2013-10-23: 20000 [IU] via TOPICAL

## 2013-10-23 MED ORDER — SODIUM CHLORIDE 0.45 % IV SOLN: INTRAVENOUS | Status: DC

## 2013-10-23 MED ORDER — SODIUM CHLORIDE 0.9 % IV SOLN
INTRAVENOUS | Status: DC
  Filled 2013-10-23: qty 1

## 2013-10-23 MED ORDER — ALBUMIN HUMAN 5 % IV SOLN
250.0000 mL | INTRAVENOUS | Status: DC | PRN
  Administered 2013-10-23: 250 mL via INTRAVENOUS
  Filled 2013-10-23: qty 250

## 2013-10-23 MED ORDER — ROCURONIUM BROMIDE 50 MG/5ML IV SOLN
INTRAVENOUS | Status: AC
  Filled 2013-10-23: qty 2

## 2013-10-23 MED ORDER — VANCOMYCIN HCL IN DEXTROSE 1-5 GM/200ML-% IV SOLN
1000.0000 mg | Freq: Once | INTRAVENOUS | Status: AC
  Administered 2013-10-23: 1000 mg via INTRAVENOUS
  Filled 2013-10-23: qty 200

## 2013-10-23 MED ORDER — LACTATED RINGERS IV SOLN: INTRAVENOUS | Status: DC

## 2013-10-23 MED ORDER — NITROGLYCERIN IN D5W 200-5 MCG/ML-% IV SOLN: 0.0000 ug/min | INTRAVENOUS | Status: DC

## 2013-10-23 MED ORDER — SODIUM CHLORIDE 0.9 % IV SOLN
INTRAVENOUS | Status: DC | PRN
  Administered 2013-10-23: 13:00:00 via INTRAVENOUS

## 2013-10-23 MED ORDER — EPHEDRINE SULFATE 50 MG/ML IJ SOLN
INTRAMUSCULAR | Status: AC
  Filled 2013-10-23: qty 1

## 2013-10-23 MED ORDER — THROMBIN 20000 UNITS EX SOLR
CUTANEOUS | Status: AC
  Filled 2013-10-23: qty 20000

## 2013-10-23 MED ORDER — PROTAMINE SULFATE 10 MG/ML IV SOLN
INTRAVENOUS | Status: DC | PRN
  Administered 2013-10-23: 50 mg via INTRAVENOUS
  Administered 2013-10-23: 60 mg via INTRAVENOUS
  Administered 2013-10-23: 20 mg via INTRAVENOUS
  Administered 2013-10-23: 40 mg via INTRAVENOUS
  Administered 2013-10-23: 30 mg via INTRAVENOUS

## 2013-10-23 MED ORDER — DEXMEDETOMIDINE HCL IN NACL 200 MCG/50ML IV SOLN
INTRAVENOUS | Status: AC
  Filled 2013-10-23: qty 50

## 2013-10-23 MED ORDER — PROTAMINE SULFATE 10 MG/ML IV SOLN
INTRAVENOUS | Status: AC
  Filled 2013-10-23: qty 25

## 2013-10-23 MED ORDER — DEXTROSE 5 % IV SOLN
1.5000 g | Freq: Two times a day (BID) | INTRAVENOUS | Status: DC
  Administered 2013-10-24 (×2): 1.5 g via INTRAVENOUS
  Filled 2013-10-23 (×3): qty 1.5

## 2013-10-23 MED ORDER — MORPHINE SULFATE 2 MG/ML IJ SOLN: 1.0000 mg | INTRAMUSCULAR | Status: DC | PRN

## 2013-10-23 MED ORDER — SODIUM CHLORIDE 0.9 % IV SOLN: INTRAVENOUS | Status: DC

## 2013-10-23 MED ORDER — DEXMEDETOMIDINE HCL IN NACL 200 MCG/50ML IV SOLN
0.1000 ug/kg/h | INTRAVENOUS | Status: DC
  Administered 2013-10-23: 0.5 ug/kg/h via INTRAVENOUS
  Filled 2013-10-23: qty 50

## 2013-10-23 MED ORDER — METOPROLOL TARTRATE 1 MG/ML IV SOLN
2.5000 mg | INTRAVENOUS | Status: DC | PRN
Start: 2013-10-23 — End: 2013-10-24

## 2013-10-23 MED ORDER — ONDANSETRON HCL 4 MG/2ML IJ SOLN
4.0000 mg | Freq: Four times a day (QID) | INTRAMUSCULAR | Status: DC | PRN
Start: 2013-10-23 — End: 2013-10-24
  Administered 2013-10-23: 4 mg via INTRAVENOUS
  Filled 2013-10-23: qty 2

## 2013-10-23 MED ORDER — SODIUM CHLORIDE 0.9 % IV SOLN: 250.0000 mL | INTRAVENOUS | Status: DC

## 2013-10-23 MED ORDER — LACTATED RINGERS IV SOLN
INTRAVENOUS | Status: DC | PRN
  Administered 2013-10-23: 10:00:00 via INTRAVENOUS

## 2013-10-23 MED ORDER — ASPIRIN 81 MG PO CHEW: 324.0000 mg | CHEWABLE_TABLET | Freq: Every day | ORAL | Status: DC

## 2013-10-23 MED ORDER — MIDAZOLAM HCL 5 MG/5ML IJ SOLN
INTRAMUSCULAR | Status: DC | PRN
  Administered 2013-10-23: 1 mg via INTRAVENOUS
  Administered 2013-10-23: 5 mg via INTRAVENOUS
  Administered 2013-10-23: 3 mg via INTRAVENOUS
  Administered 2013-10-23: 1 mg via INTRAVENOUS
  Administered 2013-10-23 (×2): 5 mg via INTRAVENOUS

## 2013-10-23 MED ORDER — ARTIFICIAL TEARS OP OINT
TOPICAL_OINTMENT | OPHTHALMIC | Status: DC | PRN
  Administered 2013-10-23: 1 via OPHTHALMIC

## 2013-10-23 MED ORDER — THROMBIN 20000 UNITS EX SOLR
OROMUCOSAL | Status: DC | PRN
  Administered 2013-10-23: 07:00:00 via TOPICAL

## 2013-10-23 MED ORDER — ACETAMINOPHEN 500 MG PO TABS
1000.0000 mg | ORAL_TABLET | Freq: Four times a day (QID) | ORAL | Status: DC
  Administered 2013-10-24 (×3): 1000 mg via ORAL
  Filled 2013-10-23 (×6): qty 2

## 2013-10-23 MED ORDER — ROCURONIUM BROMIDE 50 MG/5ML IV SOLN
INTRAVENOUS | Status: AC
  Filled 2013-10-23: qty 4

## 2013-10-23 MED ORDER — DOCUSATE SODIUM 100 MG PO CAPS
200.0000 mg | ORAL_CAPSULE | Freq: Every day | ORAL | Status: DC
  Administered 2013-10-24: 200 mg via ORAL
  Filled 2013-10-23: qty 2

## 2013-10-23 MED ORDER — HEPARIN SODIUM (PORCINE) 1000 UNIT/ML IJ SOLN
INTRAMUSCULAR | Status: AC
  Filled 2013-10-23: qty 1

## 2013-10-23 MED ORDER — MORPHINE SULFATE 2 MG/ML IJ SOLN
2.0000 mg | INTRAMUSCULAR | Status: DC | PRN
  Administered 2013-10-23 (×2): 4 mg via INTRAVENOUS
  Administered 2013-10-23: 2 mg via INTRAVENOUS
  Administered 2013-10-23 (×2): 4 mg via INTRAVENOUS
  Administered 2013-10-23: 2 mg via INTRAVENOUS
  Administered 2013-10-23 – 2013-10-24 (×7): 4 mg via INTRAVENOUS
  Filled 2013-10-23 (×10): qty 2
  Filled 2013-10-23: qty 1
  Filled 2013-10-23: qty 2
  Filled 2013-10-23: qty 1

## 2013-10-23 MED ORDER — HEMOSTATIC AGENTS (NO CHARGE) OPTIME
TOPICAL | Status: DC | PRN
  Administered 2013-10-23: 1 via TOPICAL

## 2013-10-23 MED ORDER — POTASSIUM CHLORIDE 10 MEQ/50ML IV SOLN: 10.0000 meq | INTRAVENOUS | Status: AC

## 2013-10-23 SURGICAL SUPPLY — 106 items
ATTRACTOMAT 16X20 MAGNETIC DRP (DRAPES) ×4 IMPLANT
BAG DECANTER FOR FLEXI CONT (MISCELLANEOUS) ×4 IMPLANT
BANDAGE ELASTIC 4 VELCRO ST LF (GAUZE/BANDAGES/DRESSINGS) ×4 IMPLANT
BANDAGE ELASTIC 6 VELCRO ST LF (GAUZE/BANDAGES/DRESSINGS) ×4 IMPLANT
BANDAGE GAUZE ELAST BULKY 4 IN (GAUZE/BANDAGES/DRESSINGS) ×4 IMPLANT
BASKET HEART  (ORDER IN 25'S) (MISCELLANEOUS) ×1
BASKET HEART (ORDER IN 25'S) (MISCELLANEOUS) ×1
BASKET HEART (ORDER IN 25S) (MISCELLANEOUS) ×2 IMPLANT
BLADE STERNUM SYSTEM 6 (BLADE) ×4 IMPLANT
BNDG GAUZE ELAST 4 BULKY (GAUZE/BANDAGES/DRESSINGS) ×4 IMPLANT
CANISTER SUCTION 2500CC (MISCELLANEOUS) ×4 IMPLANT
CARDIAC SUCTION (MISCELLANEOUS) ×4 IMPLANT
CATH ROBINSON RED A/P 18FR (CATHETERS) ×8 IMPLANT
CATH THORACIC 28FR (CATHETERS) ×8 IMPLANT
CATH THORACIC 36FR (CATHETERS) ×4 IMPLANT
CATH THORACIC 36FR RT ANG (CATHETERS) ×4 IMPLANT
CLIP TI MEDIUM 24 (CLIP) ×4 IMPLANT
CLIP TI WIDE RED SMALL 24 (CLIP) ×12 IMPLANT
CONN 3/8X3/8 GISH STERILE (MISCELLANEOUS) ×4 IMPLANT
CONN Y 3/8X3/8X3/8  BEN (MISCELLANEOUS) ×2
CONN Y 3/8X3/8X3/8 BEN (MISCELLANEOUS) ×2 IMPLANT
COVER SURGICAL LIGHT HANDLE (MISCELLANEOUS) ×4 IMPLANT
CRADLE DONUT ADULT HEAD (MISCELLANEOUS) ×4 IMPLANT
DRAPE CARDIOVASCULAR INCISE (DRAPES) ×2
DRAPE SLUSH/WARMER DISC (DRAPES) ×4 IMPLANT
DRAPE SRG 135X102X78XABS (DRAPES) ×2 IMPLANT
DRSG COVADERM 4X14 (GAUZE/BANDAGES/DRESSINGS) ×4 IMPLANT
ELECT CAUTERY BLADE 6.4 (BLADE) ×4 IMPLANT
ELECT REM PT RETURN 9FT ADLT (ELECTROSURGICAL) ×8
ELECTRODE REM PT RTRN 9FT ADLT (ELECTROSURGICAL) ×4 IMPLANT
GLOVE BIO SURGEON STRL SZ 6 (GLOVE) ×8 IMPLANT
GLOVE BIO SURGEON STRL SZ 6.5 (GLOVE) ×12 IMPLANT
GLOVE BIO SURGEON STRL SZ7 (GLOVE) IMPLANT
GLOVE BIO SURGEON STRL SZ7.5 (GLOVE) IMPLANT
GLOVE BIO SURGEONS STRL SZ 6.5 (GLOVE) ×4
GLOVE BIOGEL PI IND STRL 6 (GLOVE) ×8 IMPLANT
GLOVE BIOGEL PI IND STRL 6.5 (GLOVE) ×8 IMPLANT
GLOVE BIOGEL PI IND STRL 7.0 (GLOVE) ×4 IMPLANT
GLOVE BIOGEL PI INDICATOR 6 (GLOVE) ×8
GLOVE BIOGEL PI INDICATOR 6.5 (GLOVE) ×8
GLOVE BIOGEL PI INDICATOR 7.0 (GLOVE) ×4
GLOVE EUDERMIC 7 POWDERFREE (GLOVE) ×8 IMPLANT
GLOVE ORTHO TXT STRL SZ7.5 (GLOVE) IMPLANT
GOWN STRL REUS W/ TWL LRG LVL3 (GOWN DISPOSABLE) ×12 IMPLANT
GOWN STRL REUS W/ TWL XL LVL3 (GOWN DISPOSABLE) ×2 IMPLANT
GOWN STRL REUS W/TWL LRG LVL3 (GOWN DISPOSABLE) ×12
GOWN STRL REUS W/TWL XL LVL3 (GOWN DISPOSABLE) ×2
HEMOSTAT POWDER SURGIFOAM 1G (HEMOSTASIS) ×12 IMPLANT
HEMOSTAT SURGICEL 2X14 (HEMOSTASIS) ×4 IMPLANT
INSERT FOGARTY 61MM (MISCELLANEOUS) IMPLANT
INSERT FOGARTY XLG (MISCELLANEOUS) IMPLANT
KIT BASIN OR (CUSTOM PROCEDURE TRAY) ×4 IMPLANT
KIT CATH CPB BARTLE (MISCELLANEOUS) ×4 IMPLANT
KIT ROOM TURNOVER OR (KITS) ×4 IMPLANT
KIT SUCTION CATH 14FR (SUCTIONS) ×4 IMPLANT
KIT VASOVIEW W/TROCAR VH 2000 (KITS) ×8 IMPLANT
NS IRRIG 1000ML POUR BTL (IV SOLUTION) ×24 IMPLANT
PACK OPEN HEART (CUSTOM PROCEDURE TRAY) ×4 IMPLANT
PAD ARMBOARD 7.5X6 YLW CONV (MISCELLANEOUS) ×8 IMPLANT
PAD ELECT DEFIB RADIOL ZOLL (MISCELLANEOUS) ×4 IMPLANT
PENCIL BUTTON HOLSTER BLD 10FT (ELECTRODE) ×4 IMPLANT
PUNCH AORTIC ROTATE 4.0MM (MISCELLANEOUS) IMPLANT
PUNCH AORTIC ROTATE 4.5MM 8IN (MISCELLANEOUS) ×4 IMPLANT
PUNCH AORTIC ROTATE 5MM 8IN (MISCELLANEOUS) IMPLANT
SET CARDIOPLEGIA MPS 5001102 (MISCELLANEOUS) ×4 IMPLANT
SPONGE GAUZE 4X4 12PLY (GAUZE/BANDAGES/DRESSINGS) ×8 IMPLANT
SPONGE GAUZE 4X4 12PLY STER LF (GAUZE/BANDAGES/DRESSINGS) ×8 IMPLANT
SPONGE INTESTINAL PEANUT (DISPOSABLE) IMPLANT
SPONGE LAP 18X18 X RAY DECT (DISPOSABLE) IMPLANT
SPONGE LAP 4X18 X RAY DECT (DISPOSABLE) ×4 IMPLANT
SUT BONE WAX W31G (SUTURE) ×4 IMPLANT
SUT MNCRL AB 4-0 PS2 18 (SUTURE) IMPLANT
SUT PROLENE 3 0 SH DA (SUTURE) IMPLANT
SUT PROLENE 3 0 SH1 36 (SUTURE) ×4 IMPLANT
SUT PROLENE 4 0 RB 1 (SUTURE)
SUT PROLENE 4 0 SH DA (SUTURE) IMPLANT
SUT PROLENE 4-0 RB1 .5 CRCL 36 (SUTURE) IMPLANT
SUT PROLENE 5 0 C 1 36 (SUTURE) ×8 IMPLANT
SUT PROLENE 6 0 C 1 30 (SUTURE) ×4 IMPLANT
SUT PROLENE 7 0 BV 1 (SUTURE) IMPLANT
SUT PROLENE 7 0 BV1 MDA (SUTURE) ×8 IMPLANT
SUT PROLENE 8 0 BV175 6 (SUTURE) ×8 IMPLANT
SUT SILK  1 MH (SUTURE) ×2
SUT SILK 1 MH (SUTURE) ×2 IMPLANT
SUT STEEL 6MS V (SUTURE) ×4 IMPLANT
SUT STEEL STERNAL CCS#1 18IN (SUTURE) IMPLANT
SUT STEEL SZ 6 DBL 3X14 BALL (SUTURE) ×12 IMPLANT
SUT VIC AB 1 CTX 36 (SUTURE) ×4
SUT VIC AB 1 CTX36XBRD ANBCTR (SUTURE) ×4 IMPLANT
SUT VIC AB 2-0 CT1 27 (SUTURE)
SUT VIC AB 2-0 CT1 TAPERPNT 27 (SUTURE) IMPLANT
SUT VIC AB 2-0 CTX 27 (SUTURE) IMPLANT
SUT VIC AB 3-0 SH 27 (SUTURE)
SUT VIC AB 3-0 SH 27X BRD (SUTURE) IMPLANT
SUT VIC AB 3-0 X1 27 (SUTURE) IMPLANT
SUT VICRYL 4-0 PS2 18IN ABS (SUTURE) IMPLANT
SUTURE E-PAK OPEN HEART (SUTURE) ×4 IMPLANT
SYSTEM SAHARA CHEST DRAIN ATS (WOUND CARE) ×4 IMPLANT
TAPE CLOTH SURG 4X10 WHT LF (GAUZE/BANDAGES/DRESSINGS) ×4 IMPLANT
TAPE PAPER 2X10 WHT MICROPORE (GAUZE/BANDAGES/DRESSINGS) ×4 IMPLANT
TOWEL OR 17X24 6PK STRL BLUE (TOWEL DISPOSABLE) ×8 IMPLANT
TOWEL OR 17X26 10 PK STRL BLUE (TOWEL DISPOSABLE) ×8 IMPLANT
TRAY FOLEY IC TEMP SENS 16FR (CATHETERS) ×4 IMPLANT
TUBING INSUFFLATION 10FT LAP (TUBING) ×4 IMPLANT
UNDERPAD 30X30 INCONTINENT (UNDERPADS AND DIAPERS) ×4 IMPLANT
WATER STERILE IRR 1000ML POUR (IV SOLUTION) ×8 IMPLANT

## 2013-10-23 NOTE — Progress Notes (Signed)
TCTS BRIEF SICU PROGRESS NOTE  Day of Surgery  S/P Procedure(s) (LRB): CORONARY ARTERY BYPASS GRAFTING (CABG) x4 using right and left internal mammary ateries and right greater saphenous vein.  (N/A) INTRAOPERATIVE TRANSESOPHAGEAL ECHOCARDIOGRAM (N/A)   Sedated but starting to wake on vent NSR w/ stable hemodynamics off all drips Chest tube output low UOP adequate Labs okay  Plan: Continue routine early postop  OWEN,CLARENCE H 10/23/2013 4:31 PM

## 2013-10-23 NOTE — Op Note (Addendum)
CARDIOVASCULAR SURGERY OPERATIVE NOTE  10/23/2013  Surgeon:  Gaye Pollack, MD  First Assistant: Lars Pinks,  PA-C   Preoperative Diagnosis:  Severe multi-vessel coronary artery disease   Postoperative Diagnosis:  Same   Procedure:  1. Median Sternotomy 2. Extracorporeal circulation 3.   Coronary artery bypass grafting x 4   Left internal mammary graft to the LAD  Right internal mammary graft to the RCA  SVG to Diagonal  SVG to Ramus 4.   Endoscopic vein harvest from the right leg   Anesthesia:  General Endotracheal   Clinical History/Surgical Indication:  Mr. Bless is a 48 yo male with no previous history of CAD. He has history of Hyperlipidemia for which he takes Lipitor. The patient presented to an [facility] with a 2 complaint of feeling sick. The patient stated that he just felt sick along his torso. He originally thought he was suffering from indigestion, however the patient states it was more abdominal discomfort with radiation into his back. The patient took Ibuprofen and Hydrocodone which was prescribed from a recent oral surgery which originally provided relief. However on the morning of presentation he woke up and his feelings of discomfort had returned. During this episode he did have some associated nasuea, but denied palpitations, diaphoresis and shortness of breath. Hospital workup consisted of EKG with ST depression in lateral leads. Initial Troponin was elevated at 1.1. Due to this it was felt he should be admitted for further observation. He was admitted to the hospitalist service. His cardiac enzymes continued to rise and Cardiology consult was obtained. He was later ruled in for acute MI and taken for cardiac catheterization. 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 It was felt Coronary Bypass Grafting would be the treatment of choice. He was placed on a Heparin  drip and transferred to [facility] for bypass surgery. I discussed the operative procedure with the patient and family including alternatives, benefits and risks; including but not limited to bleeding, blood transfusion, infection, stroke, myocardial infarction, graft failure, heart block requiring a permanent pacemaker, organ dysfunction, and death.  Hildred Alamin understands and agrees to proceed.     Preparation:  The patient was seen in the preoperative holding area and the correct patient, correct operation were confirmed with the patient after reviewing the medical record and catheterization. The consent was signed by me. Preoperative antibiotics were given. A pulmonary arterial line and radial arterial line were placed by the anesthesia team. The patient was taken back to the operating room and positioned supine on the operating room table. After being placed under general endotracheal anesthesia by the anesthesia team a foley catheter was placed. The neck, chest, abdomen, and both legs were prepped with betadine soap and solution and draped in the usual sterile manner. A surgical time-out was taken and the correct patient and operative procedure were confirmed with the nursing and anesthesia staff.   Cardiopulmonary Bypass:  A median sternotomy was performed. The pericardium was opened in the midline. Right ventricular function appeared normal. The ascending aorta was of normal size and had no palpable plaque. There were no contraindications to aortic cannulation or cross-clamping. The patient was fully systemically heparinized and the ACT was maintained > 400 sec. The proximal aortic arch was cannulated with a 86F aortic cannula for arterial inflow. Venous cannulation was performed via the right atrial appendage using a two-staged venous cannula. An antegrade cardioplegia/vent cannula was inserted into the mid-ascending aorta. Aortic occlusion was performed with a single  cross-clamp. Systemic  cooling to 32 degrees Centigrade and topical cooling of the heart with iced saline were used. Hyperkalemic antegrade cold blood cardioplegia was used to induce diastolic arrest and was then given at about 20 minute intervals throughout the period of arrest to maintain myocardial temperature at or below 10 degrees centigrade. A temperature probe was inserted into the interventricular septum and an insulating pad was placed in the pericardium.   Left and Right internal mammary harvest:  The left side of the sternum was retracted using the Rultract retractor. The left internal mammary artery was harvested as a pedicle graft. All side branches were clipped. It was a medium-sized vessel of good quality with excellent blood flow. It was ligated distally and divided. It was sprayed with topical papaverine solution to prevent vasospasm. Then the right side of the sternum was retracted using the Rultract retractor. The right internal mammary artery was harvested as a pedicle graft. All side branches were clipped. It was a medium-sized vessel of good quality with excellent blood flow. It was ligated distally and divided. It was sprayed with topical papaverine solution to prevent vasospasm.    Endoscopic vein harvest:  The right greater saphenous vein was harvested endoscopically through a 2 cm incision medial to the right knee. It was harvested from the upper thigh to below the knee. It was a medium-sized vein of good quality. The side branches were all ligated with 4-0 silk ties.    Coronary arteries:  The coronary arteries were examined.   LAD:  Large vessel with no distal disease. The diagonal was large with mild distal disease  LCX:  Ramus was a large vessel with no distal disease.  RCA:  Large vessel with no distal disease.  The distal LCX OM was small and not felt to be suitable for grafting.   Grafts:  1. LIMA to the LAD: 2.5 mm. It was sewn end to side using 8-0 prolene continuous  suture. 2. RIMA to RCA:  2.5 mm. It was sewn end to side using 7-0 prolene continuous suture. 3. SVG to Diagonal:  1.75 mm. It was sewn end to side using 7-0 prolene continuous suture. 4. SVG to Ramus:  1.75 mm. It was sewn end to side using 7-0 prolene continuous suture.  The proximal vein graft anastomoses were performed to the mid-ascending aorta using continuous 6-0 prolene suture. Graft markers were placed around the proximal anastomoses.   Completion:  The patient was rewarmed to 37 degrees Centigrade. The clamp was removed from the LIMA pedicle and there was rapid warming of the septum and return of ventricular fibrillation. The crossclamp was removed with a time of 86 minutes. There was spontaneous return of sinus rhythm. The distal and proximal anastomoses were checked for hemostasis. The position of the grafts was satisfactory. Two temporary epicardial pacing wires were placed on the right atrium and two on the right ventricle. The patient was weaned from CPB without difficulty on no inotropes. CPB time was 108 minutes. Cardiac output was 6 LPM. Heparin was fully reversed with protamine and the aortic and venous cannulas removed. Hemostasis was achieved. Mediastinal and left pleural drainage tubes were placed. The sternum was closed with double #6 stainless steel wires. The fascia was closed with continuous # 1 vicryl suture. The subcutaneous tissue was closed with 2-0 vicryl continuous suture. The skin was closed with 3-0 vicryl subcuticular suture. All sponge, needle, and instrument counts were reported correct at the end of the case. Dry sterile dressings  were placed over the incisions and around the chest tubes which were connected to pleurevac suction. The patient was then transported to the surgical intensive care unit in critical but stable condition.

## 2013-10-23 NOTE — Procedures (Signed)
Extubation Procedure Note  Patient Details:   Name: Patrick Pope DOB: Jun 30, 1965 MRN: [id number]   Airway Documentation:     Evaluation  O2 sats: stable throughout Complications: No apparent complications Patient did tolerate procedure well. Bilateral Breath Sounds: Clear;Diminished   Yes Nif was -50, VC was 1.0 liter, able to speak. Harriet Pho B 10/23/2013, 5:45 PM

## 2013-10-23 NOTE — Anesthesia Preprocedure Evaluation (Addendum)
Anesthesia Evaluation  Patient identified by MRN, date of birth, ID band Patient awake    Reviewed: Allergy & Precautions, H&P , NPO status , Patient's Chart, lab work & pertinent test results, reviewed documented beta blocker date and time   Airway Mallampati: II TM Distance: >3 FB Neck ROM: Full    Dental  (+) Dental Advisory Given, Teeth Intact   Pulmonary          Cardiovascular Pt. on home beta blockers + CAD Rhythm:Regular     Neuro/Psych    GI/Hepatic   Endo/Other    Renal/GU      Musculoskeletal   Abdominal   Peds  Hematology   Anesthesia Other Findings   Reproductive/Obstetrics                         Anesthesia Physical Anesthesia Plan  ASA: IV  Anesthesia Plan: General   Post-op Pain Management:    Induction: Intravenous  Airway Management Planned: Oral ETT  Additional Equipment: Arterial line, CVP, PA Cath, TEE and Ultrasound Guidance Line Placement  Intra-op Plan:   Post-operative Plan: Post-operative intubation/ventilation  Informed Consent: I have reviewed the patients History and Physical, chart, labs and discussed the procedure including the risks, benefits and alternatives for the proposed anesthesia with the patient or authorized representative who has indicated his/her understanding and acceptance.   Dental advisory given  Plan Discussed with: CRNA, Anesthesiologist and Surgeon  Anesthesia Plan Comments:        Anesthesia Quick Evaluation

## 2013-10-23 NOTE — Transfer of Care (Signed)
Immediate Anesthesia Transfer of Care Note  Patient: Patrick Pope  Procedure(s) Performed: Procedure(s): CORONARY ARTERY BYPASS GRAFTING (CABG) x4 using right and left internal mammary ateries and right greater saphenous vein.  (N/A) INTRAOPERATIVE TRANSESOPHAGEAL ECHOCARDIOGRAM (N/A)  Patient Location: SICU  Anesthesia Type:General  Level of Consciousness: sedated, unresponsive and Patient remains intubated per anesthesia plan  Airway & Oxygen Therapy: Patient remains intubated per anesthesia plan and Patient placed on Ventilator (see vital sign flow sheet for setting)  Post-op Assessment: Report given to PACU RN and Post -op Vital signs reviewed and stable  Post vital signs: Reviewed and stable  Complications: No apparent anesthesia complications

## 2013-10-23 NOTE — Anesthesia Procedure Notes (Signed)
Procedure Name: Intubation Date/Time: 10/23/2013 7:51 AM Performed by: Jacob Moores Pre-anesthesia Checklist: Patient identified, Emergency Drugs available, Suction available and Patient being monitored Patient Re-evaluated:Patient Re-evaluated prior to inductionOxygen Delivery Method: Circle system utilized Preoxygenation: Pre-oxygenation with 100% oxygen Intubation Type: IV induction Ventilation: Mask ventilation without difficulty and Oral airway inserted - appropriate to patient size Laryngoscope Size: Sabra Heck and 2 Grade View: Grade II Tube type: Oral Tube size: 8.5 mm Number of attempts: 1 Airway Equipment and Method: Stylet and Oral airway Placement Confirmation: ETT inserted through vocal cords under direct vision,  positive ETCO2 and breath sounds checked- equal and bilateral Secured at: 23 cm Tube secured with: Tape Dental Injury: Teeth and Oropharynx as per pre-operative assessment

## 2013-10-23 NOTE — Anesthesia Postprocedure Evaluation (Signed)
  Anesthesia Post-op Note  Patient: Patrick Pope  Procedure(s) Performed: Procedure(s): CORONARY ARTERY BYPASS GRAFTING (CABG) x4 using right and left internal mammary ateries and right greater saphenous vein.  (N/A) INTRAOPERATIVE TRANSESOPHAGEAL ECHOCARDIOGRAM (N/A)  Patient Location: ICU  Anesthesia Type:General  Level of Consciousness: sedated  Airway and Oxygen Therapy: Patient remains intubated per anesthesia plan  Post-op Pain: none  Post-op Assessment: Post-op Vital signs reviewed, Patient's Cardiovascular Status Stable, Respiratory Function Stable, Patent Airway and Pain level controlled  Post-op Vital Signs: Reviewed and stable  Last Vitals:  Filed Vitals:   10/23/13 1400  BP: 112/81  Pulse: 86  Temp: 36.2 C  Resp: 19    Complications: No apparent anesthesia complications

## 2013-10-23 NOTE — Progress Notes (Signed)
  Echocardiogram Echocardiogram Transesophageal has been performed.  Philipp Deputy 10/23/2013, 10:11 AM

## 2013-10-23 NOTE — Brief Op Note (Signed)
10/20/2013 - 10/23/2013  11:41 AM  PATIENT:  Patrick Pope  48 y.o. male  PRE-OPERATIVE DIAGNOSIS:  1. S/p NSTEMI 2.CAD  POST-OPERATIVE DIAGNOSIS:  1. S/p NSTEMI 2.CAD  PROCEDURE:  INTRAOPERATIVE TRANSESOPHAGEAL ECHOCARDIOGRAM, CORONARY ARTERY BYPASS GRAFTING (CABG) x4 using right and left internal mammary ateries and right thigh and partial lower extremity greater saphenous vein.      SURGEON:  Surgeon(s) and Role:    Gaye Pollack, MD - Primary  PHYSICIAN ASSISTANT: Lars Pinks PA-C  ASSISTANTS:Terry Wagoner RNFA  ANESTHESIA:   general  DRAINS: Chest tube placed in the mediastinal and pleural spaces   COUNTS CORRECT:  YES  DICTATION: .Dragon Dictation  PLAN OF CARE: Admit to inpatient   PATIENT DISPOSITION:  ICU - intubated and hemodynamically stable.   Delay start of Pharmacological VTE agent (>24hrs) due to surgical blood loss or risk of bleeding: yes  BASELINE WEIGHT: 96 kg

## 2013-10-24 ENCOUNTER — Encounter (HOSPITAL_COMMUNITY): Payer: Self-pay | Admitting: Surgery

## 2013-10-24 ENCOUNTER — Inpatient Hospital Stay (HOSPITAL_COMMUNITY): Payer: 59

## 2013-10-24 LAB — GLUCOSE, CAPILLARY
GLUCOSE-CAPILLARY: 117 mg/dL — AB (ref 70–99)
GLUCOSE-CAPILLARY: 130 mg/dL — AB (ref 70–99)
GLUCOSE-CAPILLARY: 143 mg/dL — AB (ref 70–99)
GLUCOSE-CAPILLARY: 128 mg/dL — AB (ref 70–99)
GLUCOSE-CAPILLARY: 122 mg/dL — AB (ref 70–99)
GLUCOSE-CAPILLARY: 116 mg/dL — AB (ref 70–99)
GLUCOSE-CAPILLARY: 98 mg/dL (ref 70–99)
GLUCOSE-CAPILLARY: 103 mg/dL — AB (ref 70–99)
GLUCOSE-CAPILLARY: 98 mg/dL (ref 70–99)
GLUCOSE-CAPILLARY: 122 mg/dL — AB (ref 70–99)
GLUCOSE-CAPILLARY: 127 mg/dL — AB (ref 70–99)
Glucose-Capillary: 114 mg/dL — ABNORMAL HIGH (ref 70–99)
Glucose-Capillary: 118 mg/dL — ABNORMAL HIGH (ref 70–99)
Glucose-Capillary: 121 mg/dL — ABNORMAL HIGH (ref 70–99)
Glucose-Capillary: 125 mg/dL — ABNORMAL HIGH (ref 70–99)
Glucose-Capillary: 151 mg/dL — ABNORMAL HIGH (ref 70–99)
Glucose-Capillary: 120 mg/dL — ABNORMAL HIGH (ref 70–99)
Glucose-Capillary: 123 mg/dL — ABNORMAL HIGH (ref 70–99)
Glucose-Capillary: 132 mg/dL — ABNORMAL HIGH (ref 70–99)
Glucose-Capillary: 106 mg/dL — ABNORMAL HIGH (ref 70–99)
Glucose-Capillary: 116 mg/dL — ABNORMAL HIGH (ref 70–99)
Glucose-Capillary: 92 mg/dL (ref 70–99)
Glucose-Capillary: 110 mg/dL — ABNORMAL HIGH (ref 70–99)
Glucose-Capillary: 96 mg/dL (ref 70–99)
Glucose-Capillary: 108 mg/dL — ABNORMAL HIGH (ref 70–99)

## 2013-10-24 LAB — BASIC METABOLIC PANEL
BUN: 12 mg/dL (ref 6–23)
CHLORIDE: 101 meq/L (ref 96–112)
CO2: 21 mEq/L (ref 19–32)
Calcium: 8.3 mg/dL — ABNORMAL LOW (ref 8.4–10.5)
Creatinine, Ser: 0.7 mg/dL (ref 0.50–1.35)
GFR calc non Af Amer: >90 mL/min (ref >90)
GLUCOSE: 108 mg/dL — AB (ref 70–99)
Potassium: 4.1 mEq/L (ref 3.7–5.3)
SODIUM: 136 meq/L — AB (ref 137–147)

## 2013-10-24 LAB — CBC
HEMATOCRIT: 32.8 % — AB (ref 39.0–52.0)
Hemoglobin: 11.2 g/dL — ABNORMAL LOW (ref 13.0–17.0)
MCH: 29.3 pg (ref 26.0–34.0)
MCHC: 34.1 g/dL (ref 30.0–36.0)
MCV: 85.9 fL (ref 78.0–100.0)
Platelets: 160 10*3/uL (ref 150–400)
RBC: 3.82 MIL/uL — AB (ref 4.22–5.81)
RDW: 12.9 % (ref 11.5–15.5)
WBC: 14.7 10*3/uL — AB (ref 4.0–10.5)

## 2013-10-24 LAB — MAGNESIUM: Magnesium: 2.3 mg/dL (ref 1.5–2.5)

## 2013-10-24 MED ORDER — INSULIN ASPART 100 UNIT/ML ~~LOC~~ SOLN: 0.0000 [IU] | SUBCUTANEOUS | Status: DC

## 2013-10-24 MED ORDER — DOCUSATE SODIUM 100 MG PO CAPS
200.0000 mg | ORAL_CAPSULE | Freq: Every day | ORAL | Status: DC
  Administered 2013-10-25 – 2013-10-26 (×2): 200 mg via ORAL
  Filled 2013-10-24 (×3): qty 2

## 2013-10-24 MED ORDER — ASPIRIN EC 325 MG PO TBEC
325.0000 mg | DELAYED_RELEASE_TABLET | Freq: Every day | ORAL | Status: DC
  Administered 2013-10-25 – 2013-10-27 (×3): 325 mg via ORAL
  Filled 2013-10-24 (×3): qty 1

## 2013-10-24 MED ORDER — KETOROLAC TROMETHAMINE 15 MG/ML IJ SOLN
15.0000 mg | Freq: Four times a day (QID) | INTRAMUSCULAR | Status: DC | PRN
  Administered 2013-10-24 – 2013-10-25 (×2): 15 mg via INTRAVENOUS
  Filled 2013-10-24 (×2): qty 1

## 2013-10-24 MED ORDER — ONDANSETRON HCL 4 MG PO TABS: 4.0000 mg | ORAL_TABLET | Freq: Four times a day (QID) | ORAL | Status: DC | PRN

## 2013-10-24 MED ORDER — PANTOPRAZOLE SODIUM 40 MG PO TBEC
40.0000 mg | DELAYED_RELEASE_TABLET | Freq: Every day | ORAL | Status: DC
  Administered 2013-10-25 – 2013-10-27 (×3): 40 mg via ORAL
  Filled 2013-10-24 (×3): qty 1

## 2013-10-24 MED ORDER — POTASSIUM CHLORIDE CRYS ER 20 MEQ PO TBCR
20.0000 meq | EXTENDED_RELEASE_TABLET | Freq: Two times a day (BID) | ORAL | Status: AC
  Administered 2013-10-24 – 2013-10-25 (×4): 20 meq via ORAL
  Filled 2013-10-24 (×5): qty 1

## 2013-10-24 MED ORDER — FUROSEMIDE 40 MG PO TABS
40.0000 mg | ORAL_TABLET | Freq: Every day | ORAL | Status: AC
  Administered 2013-10-24 – 2013-10-26 (×3): 40 mg via ORAL
  Filled 2013-10-24 (×3): qty 1

## 2013-10-24 MED ORDER — INSULIN DETEMIR 100 UNIT/ML ~~LOC~~ SOLN
15.0000 [IU] | Freq: Once | SUBCUTANEOUS | Status: AC
  Administered 2013-10-24: 15 [IU] via SUBCUTANEOUS
  Filled 2013-10-24: qty 0.15

## 2013-10-24 MED ORDER — ENOXAPARIN SODIUM 40 MG/0.4ML ~~LOC~~ SOLN
40.0000 mg | Freq: Every day | SUBCUTANEOUS | Status: DC
  Filled 2013-10-24: qty 0.4

## 2013-10-24 MED ORDER — BISACODYL 10 MG RE SUPP: 10.0000 mg | Freq: Every day | RECTAL | Status: DC | PRN

## 2013-10-24 MED ORDER — BISACODYL 5 MG PO TBEC
10.0000 mg | DELAYED_RELEASE_TABLET | Freq: Every day | ORAL | Status: DC | PRN
  Administered 2013-10-25 – 2013-10-26 (×2): 10 mg via ORAL
  Filled 2013-10-24 (×2): qty 2

## 2013-10-24 MED ORDER — ACETAMINOPHEN 325 MG PO TABS
650.0000 mg | ORAL_TABLET | Freq: Four times a day (QID) | ORAL | Status: DC | PRN
  Administered 2013-10-25 – 2013-10-26 (×2): 650 mg via ORAL
  Filled 2013-10-24 (×2): qty 2

## 2013-10-24 MED ORDER — ONDANSETRON HCL 4 MG/2ML IJ SOLN: 4.0000 mg | Freq: Four times a day (QID) | INTRAMUSCULAR | Status: DC | PRN

## 2013-10-24 MED ORDER — MOVING RIGHT ALONG BOOK
Freq: Once | Status: AC
  Administered 2013-10-24: 15:00:00
  Filled 2013-10-24: qty 1

## 2013-10-24 MED ORDER — OXYCODONE HCL 5 MG PO TABS
5.0000 mg | ORAL_TABLET | ORAL | Status: DC | PRN
  Administered 2013-10-24: 10 mg via ORAL
  Administered 2013-10-24 – 2013-10-25 (×6): 5 mg via ORAL
  Administered 2013-10-25: 10 mg via ORAL
  Administered 2013-10-26 – 2013-10-27 (×8): 5 mg via ORAL
  Filled 2013-10-24 (×4): qty 1
  Filled 2013-10-24: qty 2
  Filled 2013-10-24 (×9): qty 1
  Filled 2013-10-24: qty 2
  Filled 2013-10-24 (×2): qty 1

## 2013-10-24 MED ORDER — SODIUM CHLORIDE 0.9 % IJ SOLN: 3.0000 mL | INTRAMUSCULAR | Status: DC | PRN

## 2013-10-24 MED ORDER — SODIUM CHLORIDE 0.9 % IV SOLN: 250.0000 mL | INTRAVENOUS | Status: DC | PRN

## 2013-10-24 MED ORDER — INSULIN ASPART 100 UNIT/ML ~~LOC~~ SOLN
0.0000 [IU] | SUBCUTANEOUS | Status: DC
  Administered 2013-10-24 – 2013-10-25 (×5): 2 [IU] via SUBCUTANEOUS

## 2013-10-24 MED ORDER — METOPROLOL TARTRATE 12.5 MG HALF TABLET
12.5000 mg | ORAL_TABLET | Freq: Two times a day (BID) | ORAL | Status: DC
  Administered 2013-10-24 – 2013-10-25 (×3): 12.5 mg via ORAL
  Filled 2013-10-24 (×5): qty 1

## 2013-10-24 MED ORDER — SODIUM CHLORIDE 0.9 % IJ SOLN
3.0000 mL | Freq: Two times a day (BID) | INTRAMUSCULAR | Status: DC
  Administered 2013-10-25 – 2013-10-27 (×4): 3 mL via INTRAVENOUS

## 2013-10-24 MED FILL — Magnesium Sulfate Inj 50%: INTRAMUSCULAR | Qty: 10 | Status: AC

## 2013-10-24 MED FILL — Sodium Chloride IV Soln 0.9%: INTRAVENOUS | Qty: 1000 | Status: AC

## 2013-10-24 MED FILL — Electrolyte-R (PH 7.4) Solution: INTRAVENOUS | Qty: 4000 | Status: AC

## 2013-10-24 MED FILL — Lidocaine HCl IV Inj 20 MG/ML: INTRAVENOUS | Qty: 5 | Status: AC

## 2013-10-24 MED FILL — Mannitol IV Soln 20%: INTRAVENOUS | Qty: 500 | Status: AC

## 2013-10-24 MED FILL — Heparin Sodium (Porcine) Inj 1000 Unit/ML: INTRAMUSCULAR | Qty: 30 | Status: AC

## 2013-10-24 MED FILL — Sodium Bicarbonate IV Soln 8.4%: INTRAVENOUS | Qty: 50 | Status: AC

## 2013-10-24 MED FILL — Potassium Chloride Inj 2 mEq/ML: INTRAVENOUS | Qty: 40 | Status: AC

## 2013-10-24 NOTE — Care Management Note (Unsigned)
    Page 1 of 1   10/24/2013     2:20:06 PM CARE MANAGEMENT NOTE 10/24/2013  Patient:  Patrick Pope, Patrick Pope   Account Number:  [id number]  Date Initiated:  10/24/2013  Documentation initiated by:  AMERSON,JULIE  Subjective/Objective Assessment:   Pt adm s/p CABG x4 on 10/23/13.  PTA, pt independent, lives with spouse.     Action/Plan:   Will follow for dc needs as pt progresses   Anticipated DC Date:  10/26/2013   Anticipated DC Plan:  [place]  CM consult      Choice offered to / List presented to:             Status of service:  In process, will continue to follow Medicare Important Message given?   (If response is "NO", the following Medicare IM given date fields will be blank) Date Medicare IM given:   Date Additional Medicare IM given:    Discharge Disposition:  HOME/SELF CARE  Per UR Regulation:  Reviewed for med. necessity/level of care/duration of stay  If discussed at [place] of Stay Meetings, dates discussed:    Comments:

## 2013-10-24 NOTE — Progress Notes (Signed)
1230- Report called to 2W, RN-Angelica. 1300- Bedside hand-off to Angelica, 2W RN. Pt transferred to 2W-25 via ambulation/wheelchair. Meds in chart, belongings and family with pt. VS stable at time of transfer. No current questions or complaints at this time.  [name], Patrick Pope

## 2013-10-24 NOTE — Progress Notes (Signed)
1 Day Post-Op Procedure(s) (LRB): CORONARY ARTERY BYPASS GRAFTING (CABG) x4 using right and left internal mammary ateries and right greater saphenous vein.  (N/A) INTRAOPERATIVE TRANSESOPHAGEAL ECHOCARDIOGRAM (N/A) Subjective: Complains of pain  Objective: Vital signs in last 24 hours: Temp:  [97 F (36.1 C)-99.9 F (37.7 C)] 98.6 F (37 C) (06/19 0700) Pulse Rate:  [52-92] 80 (06/19 0700) Cardiac Rhythm:  [-] Atrial paced (06/19 0600) Resp:  [12-30] 25 (06/19 0700) BP: (75-123)/(44-81) 109/68 mmHg (06/19 0700) SpO2:  [87 %-100 %] 97 % (06/19 0700) Arterial Line BP: (87-152)/(47-94) 122/64 mmHg (06/19 0700) FiO2 (%):  [40 %-50 %] 40 % (06/18 1709) Weight:  [95.5 kg (210 lb 8.6 oz)-101.878 kg (224 lb 9.6 oz)] 101.878 kg (224 lb 9.6 oz) (06/19 0500)  Hemodynamic parameters for last 24 hours: PAP: (21-42)/(9-27) 42/23 mmHg CO:  [3.6 L/min-7.8 L/min] 7.8 L/min CI:  [1.7 L/min/m2-3.6 L/min/m2] 3.6 L/min/m2  Intake/Output from previous day: 06/18 0701 - 06/19 0700 In: 6219.3 [I.V.:4709.3; Blood:630; NG/GT:30; IV Piggyback:850] Out: 5405 [Urine:3070; Blood:1625; Chest Tube:710] Intake/Output this shift:    General appearance: alert and cooperative Neurologic: intact Heart: regular rate and rhythm, S1, S2 normal, no murmur, click, rub or gallop Lungs: clear to auscultation bilaterally Extremities: extremities normal, atraumatic, no cyanosis or edema Wound: dressing dry  Lab Results:  Recent Labs  10/23/13 1840 10/23/13 1849 10/24/13 0410  WBC 16.1*  --  14.7*  HGB 13.2 13.6 11.2*  HCT 37.7* 40.0 32.8*  PLT 183  --  160   BMET:  Recent Labs  10/23/13 0113  10/23/13 1849 10/24/13 0410  NA 140  < > 138 136*  K 4.0  < > 4.2 4.1  CL 99  < > 106 101  CO2 26  --   --  21  GLUCOSE 120*  < > 123* 108*  BUN 17  < > 12 12  CREATININE 0.94  < > 0.80 0.70  CALCIUM 10.2  --   --  8.3*  < > = values in this interval not displayed.  PT/INR:  Recent Labs  10/23/13 1330   LABPROT 15.8*  INR 1.29   ABG    Component Value Date/Time   PHART 7.354 10/23/2013 1841   HCO3 21.6 10/23/2013 1841   TCO2 22 10/23/2013 1849   ACIDBASEDEF 4.0* 10/23/2013 1841   O2SAT 98.0 10/23/2013 1841   CBG (last 3)   Recent Labs  10/23/13 2158 10/23/13 2249 10/24/13 0003  GLUCAP 143* 128* 114*    Assessment/Plan: S/P Procedure(s) (LRB): CORONARY ARTERY BYPASS GRAFTING (CABG) x4 using right and left internal mammary ateries and right greater saphenous vein.  (N/A) INTRAOPERATIVE TRANSESOPHAGEAL ECHOCARDIOGRAM (N/A) Mobilize Diuresis Add Toradol for pain Diabetes control: Hgb A1c 5.8.  Will give him a dose of Levemir today. d/c tubes/lines Plan for transfer to step-down: see transfer orders See progression orders   LOS: 4 days    BARTLE,BRYAN K 10/24/2013

## 2013-10-24 NOTE — Progress Notes (Signed)
[id number] Offered to walk with pt but he stated he walked over an hour ago and he is tired now. Noticed pt's tray not touched. Asked pt if he would like to sit on side of bed and eat his lunch. Assisted pt to sit and set up tray. Pt needed to cough. Gave heart pillow and encouraged cough. Put on extension to oxygen so pt more comfortable. Discussed that pt needed to get two more walks in today. Stated will do one around supper and bedtime. Answered questions re activity.  We will follow up tomorrow. Graylon Good RN BSN 10/24/2013 3:12 PM

## 2013-10-25 ENCOUNTER — Inpatient Hospital Stay (HOSPITAL_COMMUNITY): Payer: 59

## 2013-10-25 LAB — CBC
HCT: 30.9 % — ABNORMAL LOW (ref 39.0–52.0)
Hemoglobin: 10.3 g/dL — ABNORMAL LOW (ref 13.0–17.0)
MCH: 29.3 pg (ref 26.0–34.0)
MCHC: 33.3 g/dL (ref 30.0–36.0)
MCV: 87.8 fL (ref 78.0–100.0)
Platelets: 139 10*3/uL — ABNORMAL LOW (ref 150–400)
RBC: 3.52 MIL/uL — ABNORMAL LOW (ref 4.22–5.81)
RDW: 13.4 % (ref 11.5–15.5)
WBC: 12.7 10*3/uL — ABNORMAL HIGH (ref 4.0–10.5)

## 2013-10-25 LAB — BASIC METABOLIC PANEL
BUN: 16 mg/dL (ref 6–23)
CALCIUM: 8.6 mg/dL (ref 8.4–10.5)
CO2: 23 mEq/L (ref 19–32)
CREATININE: 0.89 mg/dL (ref 0.50–1.35)
Chloride: 97 mEq/L (ref 96–112)
Glucose, Bld: 153 mg/dL — ABNORMAL HIGH (ref 70–99)
Potassium: 4.5 mEq/L (ref 3.7–5.3)
SODIUM: 135 meq/L — AB (ref 137–147)

## 2013-10-25 LAB — GLUCOSE, CAPILLARY
GLUCOSE-CAPILLARY: 150 mg/dL — AB (ref 70–99)
GLUCOSE-CAPILLARY: 160 mg/dL — AB (ref 70–99)
GLUCOSE-CAPILLARY: 123 mg/dL — AB (ref 70–99)
GLUCOSE-CAPILLARY: 111 mg/dL — AB (ref 70–99)
GLUCOSE-CAPILLARY: 119 mg/dL — AB (ref 70–99)
Glucose-Capillary: 115 mg/dL — ABNORMAL HIGH (ref 70–99)

## 2013-10-25 MED ORDER — INSULIN ASPART 100 UNIT/ML ~~LOC~~ SOLN
0.0000 [IU] | Freq: Three times a day (TID) | SUBCUTANEOUS | Status: DC
  Administered 2013-10-26 (×2): 2 [IU] via SUBCUTANEOUS

## 2013-10-25 NOTE — Progress Notes (Signed)
Patient has ambulated in hallway, x 2 at least 350 feet gait steady Patrick Pope

## 2013-10-25 NOTE — Progress Notes (Addendum)
[address]       [place] [address]             [phone]      2 Days Post-Op Procedure(s) (LRB): CORONARY ARTERY BYPASS GRAFTING (CABG) x4 using right and left internal mammary ateries and right greater saphenous vein.  (N/A) INTRAOPERATIVE TRANSESOPHAGEAL ECHOCARDIOGRAM (N/A) Subjective: "Got behind on pain meds" but doing better now   Objective: Vital signs in last 24 hours: Temp:  [97.6 F (36.4 C)-99.8 F (37.7 C)] 99.5 F (37.5 C) (06/20 0419) Pulse Rate:  [73-94] 94 (06/20 0419) Cardiac Rhythm:  [-] Normal sinus rhythm (06/20 0719) Resp:  [14-35] 18 (06/20 0419) BP: (97-125)/(68-78) 111/71 mmHg (06/20 0419) SpO2:  [93 %-97 %] 94 % (06/20 0419) Arterial Line BP: (106-156)/(51-72) 110/51 mmHg (06/19 1200) Weight:  [221 lb 5.5 oz (100.4 kg)] 221 lb 5.5 oz (100.4 kg) (06/20 0419)  Hemodynamic parameters for last 24 hours: PAP: (47)/(19) 47/19 mmHg  Intake/Output from previous day: 06/19 0701 - 06/20 0700 In: 183.3 [I.V.:133.3; IV Piggyback:50] Out: 3295 [Urine:1325; Chest Tube:20] Intake/Output this shift:    General appearance: alert, cooperative and no distress Heart: regular rate and rhythm Lungs: clear to auscultation bilaterally Abdomen: benign Extremities: minor edema Wound: incis healing well  Lab Results:  Recent Labs  10/24/13 0410 10/25/13 0340  WBC 14.7* 12.7*  HGB 11.2* 10.3*  HCT 32.8* 30.9*  PLT 160 139*   BMET:  Recent Labs  10/24/13 0410 10/25/13 0340  NA 136* 135*  K 4.1 4.5  CL 101 97  CO2 21 23  GLUCOSE 108* 153*  BUN 12 16  CREATININE 0.70 0.89  CALCIUM 8.3* 8.6    PT/INR:  Recent Labs  10/23/13 1330  LABPROT 15.8*  INR 1.29   ABG    Component Value Date/Time   PHART 7.354 10/23/2013 1841   HCO3 21.6 10/23/2013 1841   TCO2 22 10/23/2013 1849   ACIDBASEDEF 4.0* 10/23/2013 1841   O2SAT 98.0 10/23/2013 1841   CBG (last 3)   Recent Labs  10/25/13 0026 10/25/13 0413 10/25/13 0801    GLUCAP 115* 150* 160*   Scheduled Meds: . aspirin EC  325 mg Oral Daily  . atorvastatin  20 mg Oral q1800  . docusate sodium  200 mg Oral Daily  . furosemide  40 mg Oral Daily  . insulin aspart  0-24 Units Subcutaneous 6 times per day  . metoprolol tartrate  12.5 mg Oral BID  . pantoprazole  40 mg Oral QAC breakfast  . potassium chloride  20 mEq Oral BID  . sodium chloride  3 mL Intravenous Q12H   Continuous Infusions:  PRN Meds:.sodium chloride, acetaminophen, bisacodyl, bisacodyl, ketorolac, ondansetron (ZOFRAN) IV, ondansetron, oxyCODONE, sodium chloride  Dg Chest 2 View  10/25/2013   CLINICAL DATA:  Status post CABG  EXAM: CHEST  2 VIEW  COMPARISON:  10/24/2013  FINDINGS: Interval removal of support apparatus. Minimal gas in the subxiphoid region, commonly seen after tube removal. There is no evidence of pneumothorax. Stable heart size after CABG. Unchanged upper mediastinal widening. Low lung volumes with small bilateral pleural effusions and basilar atelectasis. No pulmonary edema.  IMPRESSION: 1. No pneumothorax after chest tube removal. 2. Small bilateral pleural effusions and basilar atelectasis.   Electronically Signed   By: Jorje Guild M.D.   On: 10/25/2013 08:04   Dg Chest Portable 1 View In Am  10/24/2013   CLINICAL DATA:  Status post CABG  EXAM:  PORTABLE CHEST - 1 VIEW  COMPARISON:  Portable chest x-ray of October 13, 2013 at 1351 hr  FINDINGS: The trachea and esophagus have been extubated. The lungs remain mildly hypoinflated but this has not worsened since the previous study. The single chest tube on the right and the 2 chest tubes on the left are stable. There is no pleural effusion or pneumothorax. The Swan-Ganz type catheter is unchanged in position with its tip in the right main pulmonary artery outflow tract. There are 7 intact sternal wires. The cardiac silhouette remains enlarged.  IMPRESSION: There is no significant interval change following extubation of the trachea and  esophagus. There is no significant pulmonary interstitial edema, pleural effusion, or pneumothorax.   Electronically Signed   By: David  Martinique   On: 10/24/2013 07:45   Dg Chest Portable 1 View  10/23/2013   CLINICAL DATA:  Status post CABG surgery  EXAM: PORTABLE CHEST - 1 VIEW  COMPARISON:  10/20/2013  FINDINGS: Since the prior exam, CABG surgery has been performed. The standard post operative lines and tubes are in place, including: An endotracheal tube with its tip 3.1 cm above the carina, the right internal jugular Swan-Ganz catheter with its tip in the right pulmonary artery, a nasogastric tube passing below the diaphragm well into the stomach, a mediastinal tube and right and left-sided chest tubes.  The cardiac silhouette is normal in size and configuration. There is no mediastinal widening. There is no lung consolidation or evidence of edema. No gross pneumothorax is evident on this supine exam.  IMPRESSION: 1. Status post CABG surgery. No evidence of an operative complication. 2. Support apparatus is well positioned.   Electronically Signed   By: Lajean Manes M.D.   On: 10/23/2013 14:06    Assessment/Plan: S/P Procedure(s) (LRB): CORONARY ARTERY BYPASS GRAFTING (CABG) x4 using right and left internal mammary ateries and right greater saphenous vein.  (N/A) INTRAOPERATIVE TRANSESOPHAGEAL ECHOCARDIOGRAM (N/A)  1 hemodyn stable in sinus rhythm 2 leukocytosis improved, H/H fairly stable ABL anemia at 10/30 3 renal fxc is normal 4 cont gentle diuresis for effusions, volume overload 5 pulm toilet for ATX 6 routine rehab    LOS: 5 days    GOLD,WAYNE E 10/25/2013   Chart reviewed, patient examined, agree with above.

## 2013-10-25 NOTE — Progress Notes (Signed)
CARDIAC REHAB PHASE I   Pt in bed upon arrival.  Pt refused to walk at this time and stated he walked this morning with nurse.  He stated he had just finished cleaning up in his room and is too fatigued.  Pt stated he will walk with nurse 2 more times today.  Encouraged activity and walking as much as possible. 1304  Lillia Dallas MS, ACSM RCEP 1:07 PM 10/25/2013

## 2013-10-26 LAB — GLUCOSE, CAPILLARY
GLUCOSE-CAPILLARY: 132 mg/dL — AB (ref 70–99)
Glucose-Capillary: 108 mg/dL — ABNORMAL HIGH (ref 70–99)
Glucose-Capillary: 122 mg/dL — ABNORMAL HIGH (ref 70–99)
Glucose-Capillary: 153 mg/dL — ABNORMAL HIGH (ref 70–99)

## 2013-10-26 MED ORDER — TRAMADOL HCL 50 MG PO TABS
50.0000 mg | ORAL_TABLET | Freq: Four times a day (QID) | ORAL | Status: DC | PRN
  Administered 2013-10-26: 50 mg via ORAL
  Filled 2013-10-26: qty 1

## 2013-10-26 MED ORDER — METOPROLOL TARTRATE 25 MG PO TABS
25.0000 mg | ORAL_TABLET | Freq: Two times a day (BID) | ORAL | Status: DC
  Administered 2013-10-26 – 2013-10-27 (×3): 25 mg via ORAL
  Filled 2013-10-26 (×4): qty 1

## 2013-10-26 MED ORDER — LISINOPRIL 2.5 MG PO TABS
2.5000 mg | ORAL_TABLET | Freq: Every day | ORAL | Status: DC
  Administered 2013-10-26: 2.5 mg via ORAL
  Filled 2013-10-26 (×2): qty 1

## 2013-10-26 NOTE — Progress Notes (Signed)
Patient complaint of feeling loopy after taking oxycodone on today, asking about other medication options, PA made aware and orders received Joylene Draft A

## 2013-10-26 NOTE — Discharge Instructions (Signed)
Endoscopic Saphenous Vein Harvesting °Care After °Refer to this sheet in the next few weeks. These instructions provide you with information on caring for yourself after your procedure. Your health care provider may also give you more specific instructions. Your treatment has been planned according to current medical practices, but problems sometimes occur. Call your health care provider if you have any problems or questions after your procedure. °HOME CARE INSTRUCTIONS °Medicine °· Take whatever pain medicine your surgeon prescribes. Follow the directions carefully. Do not take over-the-counter pain medicine unless your surgeon says it is okay. Some pain medicine can cause bleeding problems for several weeks after surgery. °· Follow your surgeon's instructions about driving. You will probably not be permitted to drive after heart surgery. °· Take any medicines your surgeon prescribes. Any medicines you took before your heart surgery should be checked with your health care provider before you start taking them again. °Wound care °· If your surgeon has prescribed an elastic bandage or stocking, ask how long you should wear it. °· Check the area around your surgical cuts (incisions) whenever your bandages (dressings) are changed. Look for any redness or swelling. °· You will need to return to have the stitches (sutures) or staples taken out. Ask your surgeon when to do that. °· Ask your surgeon when you can shower or bathe. °Activity °· Try to keep your legs raised when you are sitting. °· Do any exercises your health care providers have given you. These may include deep breathing exercises, coughing, walking, or other exercises. °SEEK MEDICAL CARE IF: °· You have any questions about your medicines. °· You have more leg pain, especially if your pain medicine stops working. °· New or growing bruises develop on your leg. °· Your leg swells, feels tight, or becomes red. °· You have numbness in your leg. °SEEK IMMEDIATE  MEDICAL CARE IF: °· Your pain gets much worse. °· Blood or fluid leaks from any of the incisions. °· Your incisions become warm, swollen, or red. °· You have chest pain. °· You have trouble breathing. °· You have a fever. °· You have more pain near your leg incision. °MAKE SURE YOU: °· Understand these instructions. °· Will watch your condition. °· Will get help right away if you are not doing well or get worse. °Document Released: 01/04/2011 Document Revised: 04/29/2013 Document Reviewed: 01/04/2011 °ExitCare® Patient Information ©2015 [employer]. This information is not intended to replace advice given to you by your health care provider. Make sure you discuss any questions you have with your health care provider. °Coronary Artery Bypass Grafting, Care After °These instructions give you information on caring for yourself after your procedure. Your doctor may also give you more specific instructions. Call your doctor if you have any problems or questions after your procedure.  °HOME CARE °· Only take medicine as told by your doctor. Take medicines exactly as told. Do not stop taking medicines or start any new medicines without talking to your doctor first. °· Take your pulse as told by your doctor. °· Do deep breathing as told by your doctor. Use your breathing device (incentive spirometer), if given, to practice deep breathing several times a day. Support your chest with a pillow or your arms when you take deep breaths or cough. °· Keep the area clean, dry, and protected where the surgery cuts (incisions) were made. Remove bandages (dressings) only as told by your doctor. If strips were applied to surgical area, do not take them off. They fall off   on their own.  Check the surgery area daily for puffiness (swelling), redness, or leaking fluid.  If surgery cuts were made in your legs:  Avoid crossing your legs.  Avoid sitting for long periods of time. Change positions every 30 minutes.  Raise your legs  when you are sitting. Place them on pillows.  Wear stockings that help keep blood clots from forming in your legs (compression stockings).  Only take sponge baths until your doctor says it is okay to take showers. Pat the surgery area dry. Do not rub the surgery area with a washcloth or towel. Do not bathe, swim, or use a hot tub until your doctor says it is okay.  Eat foods that are high in fiber. These include raw fruits and vegetables, whole grains, beans, and nuts. Choose lean meats. Avoid canned, processed, and fried foods.  Drink enough fluids to keep your pee (urine) clear or pale yellow.  Weigh yourself every day.  Rest and limit activity as told by your doctor. You may be told to:  Stop any activity if you have chest pain, shortness of breath, changes in heartbeat, or dizziness. Get help right away if this happens.  Move around often for short amounts of time or take short walks as told by your doctor. Gradually become more active. You may need help to strengthen your muscles and build endurance.  Avoid lifting, pushing, or pulling anything heavier than 10 pounds (4.5 kg) for at least 6 weeks after surgery.  Do not drive until your doctor says it is okay.  Ask your doctor when you can go back to work.  Ask your doctor when you can begin sexual activity again.  Follow up with your doctor as told. GET HELP IF:  You have puffiness, redness, more pain, or fluid draining from the incision site.  You have a fever.  You have puffiness in your ankles or legs.  You have pain in your legs.  You gain 2 or more pounds (0.9 kg) a day.  You feel sick to your stomach (nauseous) or throw up (vomit).  You have watery poop (diarrhea). GET HELP RIGHT AWAY IF:  You have chest pain that goes to your jaw or arms.  You have shortness of breath.  You have a fast or irregular heartbeat.  You notice a "clicking" in your breastbone when you move.  You have numbness or weakness in  your arms or legs.  You feel dizzy or light-headed. MAKE SURE YOU:  Understand these instructions.  Will watch your condition.  Will get help right away if you are not doing well or get worse. Document Released: 04/29/2013 Document Reviewed: 10/01/2012 [facility] Patient Information 2015 [place]. This information is not intended to replace advice given to you by your health care provider. Make sure you discuss any questions you have with your health care provider.

## 2013-10-26 NOTE — Progress Notes (Addendum)
[address]       [place] [address]             [phone]      3 Days Post-Op Procedure(s) (LRB): CORONARY ARTERY BYPASS GRAFTING (CABG) x4 using right and left internal mammary ateries and right greater saphenous vein.  (N/A) INTRAOPERATIVE TRANSESOPHAGEAL ECHOCARDIOGRAM (N/A) Subjective conts to feel better  Objective: Vital signs in last 24 hours: Temp:  [98.5 F (36.9 C)-100.6 F (38.1 C)] 99.7 F (37.6 C) (06/21 0651) Pulse Rate:  [88-101] 88 (06/21 0447) Cardiac Rhythm:  [-] Normal sinus rhythm (06/20 2023) Resp:  [17] 17 (06/20 1514) BP: (114-129)/(66-80) 129/80 mmHg (06/21 0447) SpO2:  [92 %-95 %] 92 % (06/21 0447) Weight:  [214 lb 1.6 oz (97.115 kg)] 214 lb 1.6 oz (97.115 kg) (06/21 0452)  Hemodynamic parameters for last 24 hours:    Intake/Output from previous day: 06/20 0701 - 06/21 0700 In: 480 [P.O.:480] Out: 300 [Urine:300] Intake/Output this shift:    General appearance: alert, cooperative and no distress Heart: regular rate and rhythm Lungs: min dim in the bases Abdomen: benign Extremities: + minor edema Wound: incis healing well  Lab Results:  Recent Labs  10/24/13 0410 10/25/13 0340  WBC 14.7* 12.7*  HGB 11.2* 10.3*  HCT 32.8* 30.9*  PLT 160 139*   BMET:  Recent Labs  10/24/13 0410 10/25/13 0340  NA 136* 135*  K 4.1 4.5  CL 101 97  CO2 21 23  GLUCOSE 108* 153*  BUN 12 16  CREATININE 0.70 0.89  CALCIUM 8.3* 8.6    PT/INR:  Recent Labs  10/23/13 1330  LABPROT 15.8*  INR 1.29   ABG    Component Value Date/Time   PHART 7.354 10/23/2013 1841   HCO3 21.6 10/23/2013 1841   TCO2 22 10/23/2013 1849   ACIDBASEDEF 4.0* 10/23/2013 1841   O2SAT 98.0 10/23/2013 1841   CBG (last 3)   Recent Labs  10/25/13 1626 10/25/13 2138 10/26/13 0613  GLUCAP 111* 119* 132*   Scheduled Meds: . aspirin EC  325 mg Oral Daily  . atorvastatin  20 mg Oral q1800  . docusate sodium  200 mg Oral Daily  . furosemide   40 mg Oral Daily  . insulin aspart  0-24 Units Subcutaneous TID WC  . metoprolol tartrate  12.5 mg Oral BID  . pantoprazole  40 mg Oral QAC breakfast  . sodium chloride  3 mL Intravenous Q12H   Continuous Infusions:  PRN Meds:.sodium chloride, acetaminophen, bisacodyl, bisacodyl, ketorolac, ondansetron (ZOFRAN) IV, ondansetron, oxyCODONE, sodium chloride  Dg Chest 2 View  10/25/2013   CLINICAL DATA:  Status post CABG  EXAM: CHEST  2 VIEW  COMPARISON:  10/24/2013  FINDINGS: Interval removal of support apparatus. Minimal gas in the subxiphoid region, commonly seen after tube removal. There is no evidence of pneumothorax. Stable heart size after CABG. Unchanged upper mediastinal widening. Low lung volumes with small bilateral pleural effusions and basilar atelectasis. No pulmonary edema.  IMPRESSION: 1. No pneumothorax after chest tube removal. 2. Small bilateral pleural effusions and basilar atelectasis.   Electronically Signed   By: Jorje Guild M.D.   On: 10/25/2013 08:04    Assessment/Plan: S/P Procedure(s) (LRB): CORONARY ARTERY BYPASS GRAFTING (CABG) x4 using right and left internal mammary ateries and right greater saphenous vein.  (N/A) INTRAOPERATIVE TRANSESOPHAGEAL ECHOCARDIOGRAM (N/A)  1 low grade temp, Tmax 100.6 2 sinus rhythm, hemodyn stable but tachy to 120's at times- will increse  beta blocker 3 no new labs/CXR today 4 sugars well controlled 5 ambulation improving , no SOB 6 should be able to tolerate  low dose ACEI 7 d/c epw's in am    LOS: 6 days    GOLD,WAYNE E 10/26/2013   Chart reviewed, patient examined, agree with above. He is doing well. Plan home tomorrow if no changes.

## 2013-10-26 NOTE — Discharge Summary (Signed)
Physician Discharge Summary  Patient ID: Patrick Pope MRN: [id number] DOB/AGE: May 25, 1965 48 y.o.  Admit date: 10/20/2013 Discharge date:   Admission Diagnoses:NSTEMI,3V CAD Hyperlipidemia  History of Present Illness: At time of admission Patrick Pope is a 48 yo male with no previous history of CAD. He has history of Hyperlipidemia for which he takes Lipitor. The patient presented to an [facility] with a 2 complaint of feeling sick. The patient stated that he just felt sick along his torso. He originally thought he was suffering from indigestion, however the patient states it was more abdominal discomfort with radiation into his back. The patient took Ibuprofen and Hydrocodone which was prescribed from a recent oral surgery which originally provided relief. However on the morning of presentation he woke up and his feelings of discomfort had returned. During this episode he did have some associated nasuea, but denied palpitations, diaphoresis and shortness of breath. Hospital workup consisted of EKG with ST depression in lateral leads. Initial Troponin was elevated at 1.1. Due to this it was felt he should be admitted for further observation. He was admitted to the hospitalist service. His cardiac enzymes continued to rise and Cardiology consult was obtained. He was later ruled in for acute MI and taken for cardiac catheterization. 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 It was felt Coronary Bypass Grafting would be the treatment of choice. He was placed on a Heparin drip and transferred to [facility] for bypass [facility]. Currently, the patient is chest pain free. The patient denies smoking history, but states his mother has significant heart problems in her 81s   History   Smoking status   .  Never Smoker   Smokeless tobacco   .  Never Used    History   Alcohol Use   .  Yes    History    Social History   .  Marital Status:  Married     Spouse Name:  N/A     Number of Children:  N/A   .   Years of Education:  N/A    Occupational History   .  Not on file.    Social History Main Topics   .  Smoking status:  Never Smoker   .  Smokeless tobacco:  Never Used   .  Alcohol Use:  Yes   .  Drug Use:  No   .  Sexual Activity:  Not on file    Other Topics  Concern   .  Not on file    Social History Narrative   .  No narrative on file    Current Facility-Administered Medications   Medication  Dose  Route  Frequency  Provider  Last Rate  Last Dose   .  0.9 % sodium chloride infusion   Intravenous  Continuous  Erin Barrett, PA-C     .  acetaminophen (TYLENOL) tablet 650 mg  650 mg  Oral  Q6H PRN  Erin Barrett, PA-C     .  aspirin tablet 325 mg  325 mg  Oral  Daily  Erin Barrett, PA-C     .  atorvastatin (LIPITOR) tablet 20 mg  20 mg  Oral  q1800  Erin Barrett, PA-C     .  metoprolol tartrate (LOPRESSOR) tablet 12.5 mg  12.5 mg  Oral  BID  Erin Barrett, PA-C     .  morphine 2 MG/ML injection 2 mg  2 mg  Intravenous  Q1H PRN  Erin Barrett, PA-C     .  nitroGLYCERIN (NITROSTAT) SL tablet 0.4 mg  0.4 mg  Sublingual  Q5 min PRN  Erin Barrett, PA-C     .  ondansetron (ZOFRAN) injection 4 mg  4 mg  Intravenous  Once        Discharge Diagnoses:  Active Problems:   CAD (coronary artery disease)   Discharged Condition: good  Hospital Course: Patient was admitted and maintained on a heparin drip. He was seen in cardiothoracic surgical consultation by Dr. Cyndia Bent who evaluated his studies as well as the patient. He remained medically stable and surgery was scheduled.  Consults: None  Significant Diagnostic Studies: No additional   CARDIOVASCULAR SURGERY OPERATIVE NOTE  10/23/2013  Surgeon: Gaye Pollack, MD  First Assistant: Lars Pinks, PA-C  Preoperative Diagnosis: Severe multi-vessel coronary artery disease  Postoperative Diagnosis: Same  Procedure:  1. Median Sternotomy 2. Extracorporeal circulation 3. Coronary artery bypass grafting x 4  Left internal mammary  graft to the LAD  Right internal mammary graft to the RCA  SVG to Diagonal  SVG to Ramus 4. Endoscopic vein harvest from the right leg  Anesthesia: General Endotracheal    postoperative hospital course:  Overall the patient has progressed nicely. He is maintained stable hemodynamics. He was weaned from the vent using routine protocols. All routine lines, monitors and drainage devices have been discontinued in the standard fashion. He is tolerating gradually increasing activities using standard protocols. He does have a mild postoperative anemia which is stable. Incisions are healing well without evidence of infection. He has no significant postoperative cardiac dysrhythmias. He does have some postoperative volume overload which is improving with diuresis. Oxygen has been weaned and he maintains good saturations on room air. Overall, his status is felt to be tentatively stable for discharge in the next 24 hours pending ongoing reevaluation of his recovery.   Discharge Exam: Blood pressure 132/78, pulse 94, temperature 99.5 F (37.5 C), temperature source Oral, resp. rate 18, height 5\' 11"  (1.803 m), weight 208 lb 15.9 oz (94.8 kg), SpO2 97.00%.  Cardiovascular: RRR, no murmurs, gallops, or rubs.  Pulmonary: Clear to auscultation bilaterally; no rales, wheezes, or rhonchi.  Abdomen: Soft, non tender, bowel sounds present.  Extremities: No lower extremity edema.  Wounds: Clean and dry. No erythema or signs of infection.   Disposition: Final discharge disposition not confirmed  Medications at time of discharge:   Medication List    STOP taking these medications       ibuprofen 600 MG tablet  Commonly known as:  ADVIL,MOTRIN      TAKE these medications       aspirin 325 MG EC tablet  Take 1 tablet (325 mg total) by mouth daily.     atorvastatin 20 MG tablet  Commonly known as:  LIPITOR  Take 20 mg by mouth every evening.     lisinopril 5 MG tablet  Commonly known as:   PRINIVIL,ZESTRIL  Take 1 tablet (5 mg total) by mouth daily.     metoprolol tartrate 25 MG tablet  Commonly known as:  LOPRESSOR  Take 1 tablet (25 mg total) by mouth 2 (two) times daily.     oxyCODONE 5 MG immediate release tablet  Commonly known as:  Oxy IR/ROXICODONE  Take 1-2 tablets (5-10 mg total) by mouth every 4 (four) hours as needed for severe pain.       Follow-up Information   Follow up with Gaye Pollack, MD. (The office will contact you regarding an appointment to see Dr. Cyndia Bent  in 4 weeks. Please obtain a chest x-ray a [facility] 1 hour prior to this appointment. [facility] is located in the same office complex.)    Specialty:  Cardiothoracic Surgery   Contact information:   [address] [place] [address] [phone]       Follow up with Neoma Laming A., MD. (2 weeks, contact office to arrange this appointment)    Specialty:  Cardiology   Contact information:   [place] [address] [phone]       Follow up with Gaye Pollack, MD On 11/03/2013. (Appointment is with nurse only to have chest tube sutures removed. Office will mail appointment time)    Specialty:  Cardiothoracic Surgery   Contact information:   [place] [address] [phone]       Follow up with Medical doctor. (Establish with medical doctor to have pre op HGA1C 5.8 monitored)     The patient has been discharged on:   1.Beta Blocker:  Yes [ y  ]                              No   [   ]                              If No, reason:  2.Ace Inhibitor/ARB: Yes [   y]                                     No  [    ]                                     If No, reason:  3.Statin:   Yes Blue.Reese   ]                  No  [   ]                  If No, reason:  4.Ecasa:  Yes  [ y  ]                  No   [   ]                  If No, reason:   Signed: ZIMMERMAN,DONIELLE M PA-C 10/27/2013, 7:50 AM

## 2013-10-27 LAB — GLUCOSE, CAPILLARY
GLUCOSE-CAPILLARY: 110 mg/dL — AB (ref 70–99)
Glucose-Capillary: 118 mg/dL — ABNORMAL HIGH (ref 70–99)

## 2013-10-27 MED ORDER — ASPIRIN 325 MG PO TBEC: 325.0000 mg | DELAYED_RELEASE_TABLET | Freq: Every day | ORAL | Status: AC

## 2013-10-27 MED ORDER — METOPROLOL TARTRATE 25 MG PO TABS: 25.0000 mg | ORAL_TABLET | Freq: Two times a day (BID) | ORAL | Status: AC

## 2013-10-27 MED ORDER — LISINOPRIL 5 MG PO TABS: 5.0000 mg | ORAL_TABLET | Freq: Every day | ORAL | Status: AC

## 2013-10-27 MED ORDER — OXYCODONE HCL 5 MG PO TABS: 5.0000 mg | ORAL_TABLET | ORAL | Status: DC | PRN

## 2013-10-27 MED ORDER — LISINOPRIL 5 MG PO TABS
5.0000 mg | ORAL_TABLET | Freq: Every day | ORAL | Status: DC
  Administered 2013-10-27: 5 mg via ORAL
  Filled 2013-10-27: qty 1

## 2013-10-27 NOTE — Progress Notes (Signed)
Pt ambulated around the unit x2 independently. No complaints of pain or shortness of breath. Will continue to monitor.

## 2013-10-27 NOTE — Progress Notes (Addendum)
10/27/2013 12:11 PM Nursing note Discharge avs form,medications already taken today and those due this evening given and explained to patient and family. Follow up appointments, incision site care, activity restrictions and when to call MD reviewed. D/c iv line. D/c tele. D/c home three hours after EPW removal per verbal order Lars Pinks [facility] with family per orders.  Zaniel Marineau, Arville Lime

## 2013-10-27 NOTE — Progress Notes (Addendum)
      [address]       [place] [address]             [phone]        4 Days Post-Op Procedure(s) (LRB): CORONARY ARTERY BYPASS GRAFTING (CABG) x4 using right and left internal mammary ateries and right greater saphenous vein.  (N/A) INTRAOPERATIVE TRANSESOPHAGEAL ECHOCARDIOGRAM (N/A)  Subjective: Patient without complaints. Would like to go home.  Objective: Vital signs in last 24 hours: Temp:  [97.6 F (36.4 C)-101.8 F (38.8 C)] 99.5 F (37.5 C) (06/22 0526) Pulse Rate:  [94-104] 94 (06/22 0526) Cardiac Rhythm:  [-] Normal sinus rhythm (06/21 0750) Resp:  [18-19] 18 (06/22 0526) BP: (122-144)/(78-88) 132/78 mmHg (06/22 0526) SpO2:  [96 %-97 %] 97 % (06/22 0526) Weight:  [208 lb 15.9 oz (94.8 kg)] 208 lb 15.9 oz (94.8 kg) (06/22 0526)  Pre op weight 96 kg Current Weight  10/27/13 208 lb 15.9 oz (94.8 kg)      Intake/Output from previous day: 06/21 0701 - 06/22 0700 In: 120 [P.O.:120] Out: -    Physical Exam:  Cardiovascular: RRR, no murmurs, gallops, or rubs. Pulmonary: Clear to auscultation bilaterally; no rales, wheezes, or rhonchi. Abdomen: Soft, non tender, bowel sounds present. Extremities: No lower extremity edema. Wounds: Clean and dry.  No erythema or signs of infection.  Lab Results: CBC: Recent Labs  10/25/13 0340  WBC 12.7*  HGB 10.3*  HCT 30.9*  PLT 139*   BMET:  Recent Labs  10/25/13 0340  NA 135*  K 4.5  CL 97  CO2 23  GLUCOSE 153*  BUN 16  CREATININE 0.89  CALCIUM 8.6    PT/INR:  Lab Results  Component Value Date   INR 1.29 10/23/2013   INR 0.97 10/23/2013   ABG:  INR: Will add last result for INR, ABG once components are confirmed Will add last 4 CBG results once components are confirmed  Assessment/Plan:  1. CV - SR in the 90's. On Lopressor 25 bid and Lisinoopril 2.5 daily. Will increase Lisinopril for better BP control. 2.  Pulmonary - Encourage incentive spirometer 3. Pre op HGA1C 5.8. Is  pre diabetic. Will need further surveillance as an outpatient. Stop accu checks and SS PRN. 4.  Acute blood loss anemia - Last H and H 10.3 and 30.9 5. Mild thrombocytopenia-platelets 139,000 6. Remove EPW 7. Chest tube sutures to remain 8. Likely discharge after lunch  ZIMMERMAN,DONIELLE MPA-C 10/27/2013,7:29 AM

## 2013-10-27 NOTE — Progress Notes (Signed)
10/27/2013 9:02 AM Nursing note EPW d/c per orders and per protocol. Ends intact. Pt. Tolerated well.  VS collected per protocol. Pt. Advised of bedrest for one hour post removal. Call bell within reach. Will continue to monitor patient.  [place], Patrick Pope

## 2013-10-27 NOTE — Progress Notes (Signed)
Pt walking independently without problems. Ed completed with father present. Voiced understanding with good reception. Pt eager to make change. Interested in [facility] and will send referral to [facility]. [phone] [place], ACSM 11:48 AM 10/27/2013

## 2013-10-31 LAB — PULMONARY FUNCTION TEST
DL/VA % PRED: 115 %
DL/VA: 5.45 ml/min/mmHg/L
DLCO COR % PRED: 104 %
DLCO COR: 35.26 ml/min/mmHg
DLCO unc % pred: 105 %
DLCO unc: 35.56 ml/min/mmHg
FEF 25-75 PRE: 4 L/s
FEF 25-75 Post: 4.44 L/sec
FEF2575-%CHANGE-POST: 11 %
FEF2575-%Pred-Post: 123 %
FEF2575-%Pred-Pre: 111 %
FEV1-%CHANGE-POST: 4 %
FEV1-%PRED-POST: 96 %
FEV1-%PRED-PRE: 92 %
FEV1-POST: 3.91 L
FEV1-PRE: 3.75 L
FEV1FVC-%CHANGE-POST: 4 %
FEV1FVC-%Pred-Pre: 106 %
FEV6-%Change-Post: 0 %
FEV6-%Pred-Post: 89 %
FEV6-%Pred-Pre: 88 %
FEV6-PRE: 4.5 L
FEV6-Post: 4.53 L
FEV6FVC-%Change-Post: 0 %
FEV6FVC-%PRED-PRE: 102 %
FEV6FVC-%Pred-Post: 103 %
FVC-%Change-Post: 0 %
FVC-%Pred-Post: 86 %
FVC-%Pred-Pre: 86 %
FVC-Post: 4.53 L
FVC-Pre: 4.53 L
POST FEV1/FVC RATIO: 86 %
POST FEV6/FVC RATIO: 100 %
Pre FEV1/FVC ratio: 83 %
Pre FEV6/FVC Ratio: 99 %
RV % pred: 86 %
RV: 1.8 L
TLC % PRED: 87 %
TLC: 6.29 L

## 2013-11-03 ENCOUNTER — Ambulatory Visit (INDEPENDENT_AMBULATORY_CARE_PROVIDER_SITE_OTHER): Payer: Self-pay

## 2013-11-03 DIAGNOSIS — D381 Neoplasm of uncertain behavior of trachea, bronchus and lung: Secondary | ICD-10-CM

## 2013-11-03 DIAGNOSIS — G8918 Other acute postprocedural pain: Secondary | ICD-10-CM

## 2013-11-03 DIAGNOSIS — Z4802 Encounter for removal of sutures: Secondary | ICD-10-CM

## 2013-11-03 MED ORDER — HYDROCODONE-ACETAMINOPHEN 5-325 MG PO TABS: 1.0000 | ORAL_TABLET | Freq: Four times a day (QID) | ORAL | Status: DC | PRN

## 2013-11-03 NOTE — Progress Notes (Signed)
Removed 4 sutures from chest tube sites, No signs of infection and patient tolerated well

## 2013-11-18 ENCOUNTER — Other Ambulatory Visit: Payer: Self-pay | Admitting: *Deleted

## 2013-11-18 DIAGNOSIS — G8918 Other acute postprocedural pain: Secondary | ICD-10-CM

## 2013-11-18 DIAGNOSIS — Z4802 Encounter for removal of sutures: Secondary | ICD-10-CM

## 2013-11-18 MED ORDER — HYDROCODONE-ACETAMINOPHEN 5-325 MG PO TABS: 1.0000 | ORAL_TABLET | Freq: Four times a day (QID) | ORAL | Status: DC | PRN

## 2013-11-20 ENCOUNTER — Encounter: Payer: Self-pay | Admitting: Cardiology

## 2013-11-24 ENCOUNTER — Other Ambulatory Visit: Payer: Self-pay | Admitting: Surgery

## 2013-11-24 DIAGNOSIS — I2510 Atherosclerotic heart disease of native coronary artery without angina pectoris: Secondary | ICD-10-CM

## 2013-11-26 ENCOUNTER — Other Ambulatory Visit: Payer: Self-pay | Admitting: *Deleted

## 2013-11-26 ENCOUNTER — Other Ambulatory Visit: Payer: Self-pay | Admitting: Physician Assistant

## 2013-11-26 ENCOUNTER — Ambulatory Visit
Admission: RE | Admit: 2013-11-26 | Discharge: 2013-11-26 | Disposition: A | Discharge status: Home / Self Care | Payer: 59 | Source: Ambulatory Visit | Attending: Surgery | Admitting: Surgery

## 2013-11-26 ENCOUNTER — Encounter: Payer: Self-pay | Admitting: Surgery

## 2013-11-26 ENCOUNTER — Ambulatory Visit (INDEPENDENT_AMBULATORY_CARE_PROVIDER_SITE_OTHER): Payer: Self-pay | Admitting: Surgery

## 2013-11-26 VITALS — BP 111/80 | HR 75 | Resp 20 | Ht 71.0 in | Wt 209.0 lb

## 2013-11-26 DIAGNOSIS — I2510 Atherosclerotic heart disease of native coronary artery without angina pectoris: Secondary | ICD-10-CM

## 2013-11-26 DIAGNOSIS — G8918 Other acute postprocedural pain: Secondary | ICD-10-CM

## 2013-11-26 DIAGNOSIS — Z951 Presence of aortocoronary bypass graft: Secondary | ICD-10-CM

## 2013-11-26 DIAGNOSIS — Z4802 Encounter for removal of sutures: Secondary | ICD-10-CM

## 2013-11-26 MED ORDER — HYDROCODONE-ACETAMINOPHEN 5-325 MG PO TABS: 1.0000 | ORAL_TABLET | Freq: Four times a day (QID) | ORAL | Status: AC | PRN

## 2013-11-26 NOTE — Progress Notes (Signed)
      HPI:  Patient returns for routine postoperative follow-up having undergone CABG x 4  on 10/23/2013. The patient's early postoperative recovery while in the hospital was notable for an uncomplicated postop course. Since hospital discharge the patient reports that he has been feeling well. He is participating in cardiac rehab at [place] Refill  . aspirin EC 325 MG EC tablet Take 1 tablet (325 mg total) by mouth daily.  30 tablet  0  . atorvastatin (LIPITOR) 20 MG tablet Take 20 mg by mouth every evening.      Marland Kitchen HYDROcodone-acetaminophen (NORCO/VICODIN) 5-325 MG per tablet Take 1 tablet by mouth every 6 (six) hours as needed for moderate pain.  40 tablet  0  . lisinopril (PRINIVIL,ZESTRIL) 5 MG tablet Take 1 tablet (5 mg total) by mouth daily.  30 tablet  1  . metoprolol tartrate (LOPRESSOR) 25 MG tablet Take 1 tablet (25 mg total) by mouth 2 (two) times daily.  60 tablet  0   No current facility-administered medications for this visit.    Physical Exam: BP 111/80  Pulse 75  Resp 20  Ht 5\' 11"  (1.803 m)  Wt 209 lb (94.802 kg)  BMI 29.16 kg/m2  SpO2 97% He looks well Lungs are clear Cardiac exam shows a regular rate and rhythm with normal heart sounds. The chest incision is healing well and the sternum is stable. The leg incisions are healing well and there is no significant edema.   Diagnostic Tests:  CLINICAL DATA:  Five week follow-up from CABG   EXAM: CHEST  2 VIEW   COMPARISON:  PA and lateral chest x-ray of October 25, 2013.   FINDINGS: The lungs are well-expanded. There is no focal infiltrate. Mildly increased linear density in the right perihilar region is demonstrated. The heart and pulmonary vascularity are normal. There are 7 intact sternal wires.   IMPRESSION: There is no evidence of CHF nor other acute cardiopulmonary abnormality. Minimal left-sided perihilar subsegmental  atelectasis or scarring persists.     Electronically Signed   By: David  Martinique   On: 11/26/2013 09:40     Impression:  Overall I think he is doing well. I encouraged him to continue walking. He will continue to participate in cardiac rehab. I told him he could drive his car but should not lift anything heavier than 10 lbs for three months postop. He will continue his current medication regimen.   Plan:  He will follow up with Dr. Saralyn Pilar for his cardiology care and will return to see me if he develops any problems with his incisions.

## 2013-12-06 ENCOUNTER — Encounter: Payer: Self-pay | Admitting: Cardiology

## 2013-12-23 ENCOUNTER — Other Ambulatory Visit: Payer: Self-pay | Admitting: Physician Assistant

## 2014-01-06 ENCOUNTER — Encounter: Payer: Self-pay | Admitting: Cardiology

## 2014-02-05 ENCOUNTER — Encounter: Payer: Self-pay | Admitting: Cardiology

## 2014-08-29 NOTE — Discharge Summary (Signed)
PATIENT NAMEMONTRAY, Patrick Pope MR#:  [id number] DATE OF BIRTH:  11/02/1965  DATE OF ADMISSION:  10/19/2013 DATE OF DISCHARGE:  10/20/2013  ADMISSION DIAGNOSIS: Non-ST-segment elevation myocardial infarction.  DISCHARGE DIAGNOSES: 1.  Non-ST-segment elevation myocardial infarction. 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  2.  Hyperlipidemia.   CONSULTATIONS: Dr. Neoma Laming  PROCEDURES: The patient underwent a cardiac catheterization on October 20, 2013 which showed an EF of 60%. He has 3 vessel disease with high grade distal left main disease.   DIAGNOSTIC DATA: White blood cells 12.4, hemoglobin 15, hematocrit 45, platelets 211,000. Sodium 137, potassium 3.7, chloride 107, bicarb 24, BUN 11, creatinine 0.95, glucose 102.   Troponin 5.50. CPK-MB 40.7.   LDL 92. Cholesterol 176.   HOSPITAL COURSE: A 49 year old male with history of hyperlipidemia who presented with chest pain, found to have non-STEMI. For further details, please refer to the H and P. 1.  Non-STEMI. The patient was admitted to the hospitalist service. He was started on aspirin, statin, metoprolol and heparin drip. His troponins continued to climb. Cardiology was consulted. He underwent a cardiac catheterization which showed severe 3 vessel disease. His EKG on admission also did show some ST depressions. He will be transferred to [facility] for further evaluation and probable CABG.  2.  Hyperlipidemia. LDL is at goal. Continue lovastatin.   DISCHARGE MEDICATIONS: 1.  Aspirin 81 mg daily.  2.  Lovastatin 20 mg daily.  3.  Metoprolol 12.5 b.i.d.  4.  Nitroglycerin 2 inches topical b.i.d.   DISCHARGE DIET: Low-sodium low-fat.  DISCHARGE ACTIVITY: As tolerated.  DISCHARGE FOLLOWUP: The patient is transferred to [facility] for further evaluation for CABG.  TIME SPENT: 40 minutes.   ____________________________ Donell Beers. Benjie Karvonen, MD spm:sb D: 10/20/2013 12:14:42 ET T: 10/20/2013 12:29:18  ET JOB#: [id number]  cc: Riaz Onorato P. Benjie Karvonen, MD, <Dictator> Donell Beers Scharlene Catalina MD ELECTRONICALLY SIGNED 10/21/2013 11:18

## 2014-08-29 NOTE — Consult Note (Signed)
PATIENT NAMEDEMARIOUS, Patrick Pope MR#:  [id number] DATE OF BIRTH:  1965-07-18  DATE OF CONSULTATION:  10/20/2013  REFERRING PHYSICIAN:   CONSULTING PHYSICIAN:  Dionisio David, MD  HISTORY OF PRESENT ILLNESS: This is a 49 year old white male with a past medical history of hyperlipidemia, hypertension and kidney stone, presented to the Emergency Room with two days' onset of chest pain. He had an abnormal EKG, with ST depressions suggestive of subendocardial myocardial infarction/non-ST elevation myocardial infarction. Thus, I was asked to evaluate the patient. He denies any chest pain at this time. The patient states on Friday he started feeling uncomfortable in his chest, described as heaviness and tightness, and that resolved. Saturday he worked in the yard. There was no problem. Sunday again, he started having heaviness and tightness in the chest. He just did not feel good. Came into the Emergency Room, where aspirin and nitro paste and heparin, and he got better.   PAST MEDICAL HISTORY: History of hyperlipidemia, hypertension.   MEDICATIONS: He takes Lipitor 20 mg once a day.   ALLERGIES: None.   SOCIAL HISTORY: Quit smoking 10 years ago. No EtOH abuse.   FAMILY HISTORY: Significant for having premature coronary artery disease in mother and rest of the family, having bypass surgery, angioplasty and peripheral vascular disease.   PHYSICAL EXAMINATION: GENERAL: He is alert, oriented x3, in no acute distress.  VITAL SIGNS: Blood pressure 120/84, respirations 21, pulse 75, temperature 98.4, saturation 96.  HEENT: No JVD.  LUNGS: Clear.  HEART: Regular rate and rhythm. Normal S1, S2. No audible murmur.  ABDOMEN: Soft, nontender, positive bowel sounds.  EXTREMITIES: No pedal edema.  NEUROLOGIC: The patient appears to be intact.   DIAGNOSTIC DATA: His EKG showed ST depression, about 2 to 3 mm, V4 to V6, I, aVL, and ST elevation in aVR. His cardiac enzymes reveal troponin of 2, MB fraction 40. The  second troponin is 5.5. His cholesterol is 176, triglycerides 151, HDL is 54, LDL is 92. Initial troponin was 1.1. It steadily keeps going up. BUN and creatinine are normal.   ASSESSMENT AND PLAN: The patient has non-ST segment elevation myocardial infarction, and was started on heparin, aspirin, Plavix. Has been stabilized and denies any chest pain at this time. However, with his risk factors such as hypertension, hyperlipidemia and strong family history of premature coronary artery disease, and having elevated troponin along with ST depressions, also suggest non-ST-elevation myocardial infarction. Thus we will schedule the patient for cardiac catheterization.   Thank you very much for the referral.    ____________________________ Dionisio David, MD sak:cg D: 10/20/2013 07:10:51 ET T: 10/20/2013 07:46:12 ET JOB#: [id number]  cc: Dionisio David, MD, <Dictator> Dionisio David MD ELECTRONICALLY SIGNED 11/19/2013 9:21

## 2014-08-29 NOTE — H&P (Signed)
[name], Patrick Pope MR#:  [id number] DATE OF BIRTH:  [birth year]  DATE OF ADMISSION:  10/19/2013  ADMITTING PHYSICIAN: Gladstone Lighter, MD  PRIMARY CARE PHYSICIAN: Dr. Debbora Dus   CHIEF COMPLAINT: Feeling sick, and flank pain.   HISTORY OF PRESENT ILLNESS: [name] is a 49 year old, very pleasant Caucasian male with no significant past medical history, other than hyperlipidemia and remote history of kidney stones, presents to the hospital with a 2-day history of feeling sick. He says 2 days ago he just felt sick in his torso. He thought he was indigestion. It was more abdominal discomfort, and also low back pain. He felt better yesterday. He took 600 mg of Motrin and also hydrocodone, which he got for recent oral surgery about a week ago. This morning when he woke up, he felt that his feeling of discomfort has returned. He denies any chest pain per se. No radiation down the arms. No jaw pain. Felt nauseated during the episode. No diaphoresis, dizziness, or difficulty breathing. With his symptoms returning, he presents back to the hospital and noted to have elevated troponin of 1.1 at this time. He also had significant ST depressions in lateral leads. He is being admitted for non-ST segment elevation MI.   PAST MEDICAL HISTORY:  Hyperlipidemia.   PAST SURGICAL HISTORY: Recent oral surgery on left lower jaw.   MEDICATIONS AT HOME:  Lipitor 20 mg p.o. daily.   ALLERGIES TO MEDICATIONS: No known drug allergies.   SOCIAL HISTORY:  The patient works at [employer]. No smoking. Occasional alcohol abuse.   FAMILY HISTORY: Mom with significant heart problem in her 17s. Also has atherosclerosis in the family.    REVIEW OF SYSTEMS: CONSTITUTIONAL: No fever, fatigue or weakness.  EYES: No blurred vision, double vision, glaucoma or cataracts.  ENT: No tinnitus, ear pain, hearing loss, epistaxis or discharge.  RESPIRATORY: No cough, wheeze, hemoptysis, COPD. CARDIOVASCULAR: No chest  pain, orthopnea, edema, arrhythmia, palpitations, or syncope.  GASTROINTESTINAL: No nausea, vomiting, diarrhea, abdominal pain, hematemesis, or melena.  GENITOURINARY: No dysuria, hematochezia, renal calculus, frequency, or incontinence.  ENDOCRINE: No polyuria, nocturia, thyroid problems, heat or cold intolerance.   HEMATOLOGY: No anemia, easy bruising or bleeding. SKIN:  No acne, rash, or lesions.  MUSCULOSKELETAL: Generalized body aches. No arthritis or gout.  NEUROLOGIC: No numbness, weakness, CVA, TIA, or seizures.  PSYCHOLOGICAL: No anxiety, insomnia, or depression.   PHYSICAL EXAMINATION:  VITAL SIGNS:  Temperature is 98.9 degrees Fahrenheit, pulse 90, respirations 18, blood pressure 100/59, pulse ox 99% on room air.  GENERAL: Well-built, well-nourished male lying in bed, not in any acute distress.  HEENT: Normocephalic, atraumatic. Pupils equal, round, reacting to light. Anicteric sclerae. Extraocular movements intact. Oropharynx is clear without erythema, mass or exudates.  NECK: Supple. No thyromegaly, JVD or carotid bruits. No lymphadenopathy.  LUNGS: Moving air bilaterally. No wheeze or crackles heard. No use of accessory muscles for breathing CARDIOVASCULAR: S1, S2. Regular rate and rhythm. No murmurs, rubs, or gallops.  ABDOMEN: Soft, nontender, nondistended. No hepatosplenomegaly. Normal bowel sounds.  EXTREMITIES: No pedal edema. No clubbing or cyanosis. There are 2+ dorsalis pedis pulses palpable bilaterally.  SKIN: No acne, rash or lesions.  LYMPHATICS: No cervical lymphadenopathy.  NEUROLOGIC: Cranial nerves II through XII remain grossly intact. No focal motor or sensory deficits.  PSYCHOLOGICAL: The patient is awake, alert x 3.   LAB DATA: Sodium 138, potassium 3.9, chloride 106, bicarb 26, BUN 17, creatinine 0.76, glucose 123, and calcium of 9.6. WBC 9.3,  hemoglobin 16.4, hematocrit 48.9, platelet count 233. CK-MB is 8.0. Troponin is elevated at 1.10. Urinalysis negative  for any infection. PTT is 32.6, INR 0.9. EKG showing ST depressions in V4 to V6 and also 1 and aVL. No ST elevation noted.   ASSESSMENT AND PLAN: A 49 year old male with history of hyperglycemia, presents to the hospital secondary to feeling sick and flank pain bilaterally, but noted to have elevated troponin and also ST depressions in lateral leads.   1.  Non-ST segment elevation myocardial infarction. Admit to telemetry. Start on IV heparin drip. Cardiology has been consulted. Dr. Humphrey Rolls will do cardiac catheterization tomorrow. The patient does have strong family history of coronary artery disease. He is chest pain free, so will not do any Integrilin or nitro drip. Will place nitro paste, and 300 mg of Plavix has been given for loading dose. Aspirin also received in the ER. Hba1c and  lipid profile for risk stratification. The patient will be started on aspirin, nitro, statin, and metoprolol has been added.   2.  Hyperlipemia, on statin.   3.  Deep vein thrombosis prophylaxis, on heparin.  4.  Code status:  FULL CODE.  Time spent on admission is 50 minutes.     ____________________________ Gladstone Lighter, MD rk:mr D: 10/19/2013 14:59:05 ET T: 10/19/2013 16:23:57 ET JOB#: [id number]  cc: Gladstone Lighter, MD, <Dictator> Milinda Pointer. Jacqualine Code, MD  Gladstone Lighter MD ELECTRONICALLY SIGNED 10/27/2013 15:02

## 2015-03-27 IMAGING — CR DG CHEST 2V
2 series · 2 of 2 positions shown · non-contrast
Comparison: PA and lateral chest x-ray October 25, 2013.

CLINICAL DATA: Five week follow-up from CABG

EXAM:
CHEST  2 VIEW

[w chest pa]
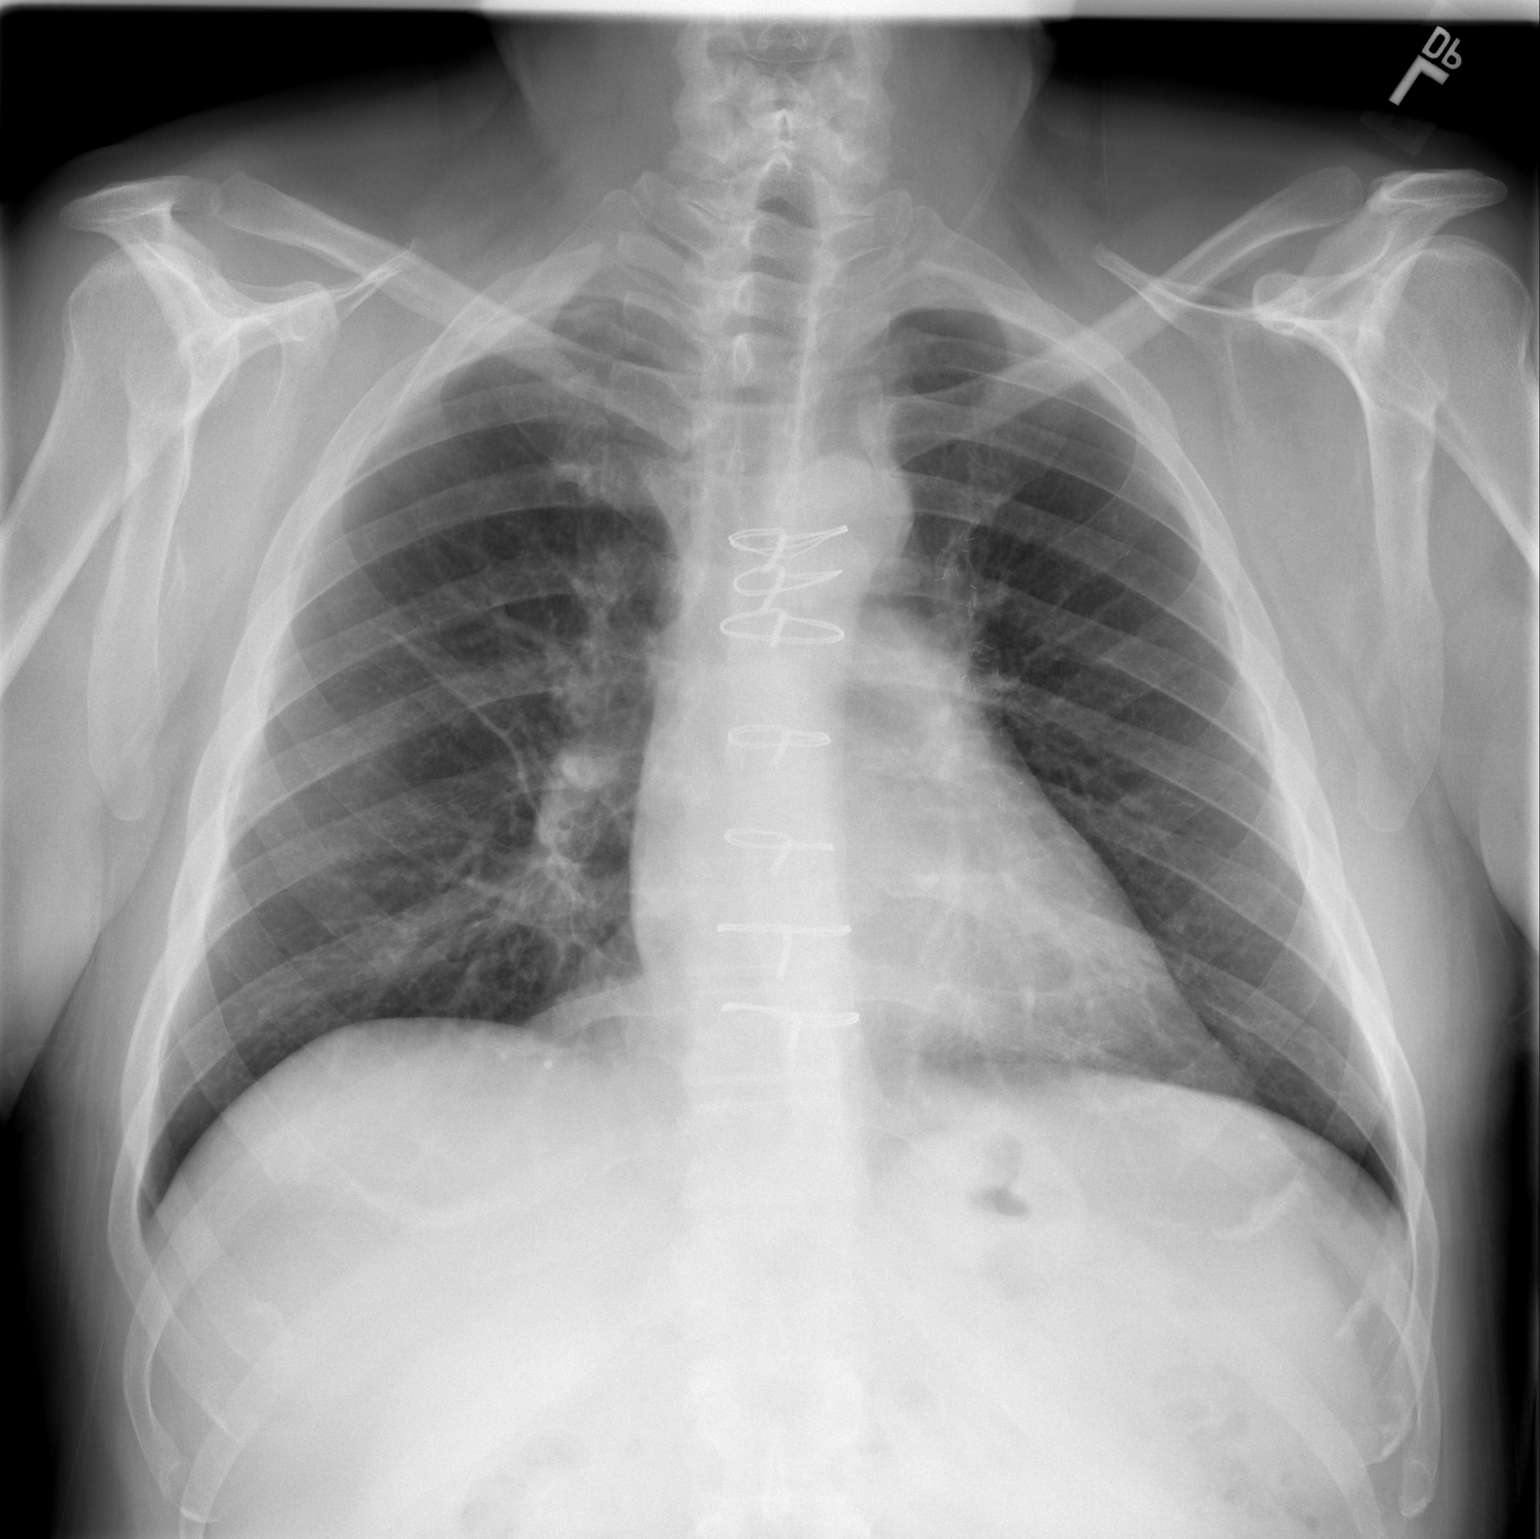

[w chest lat]
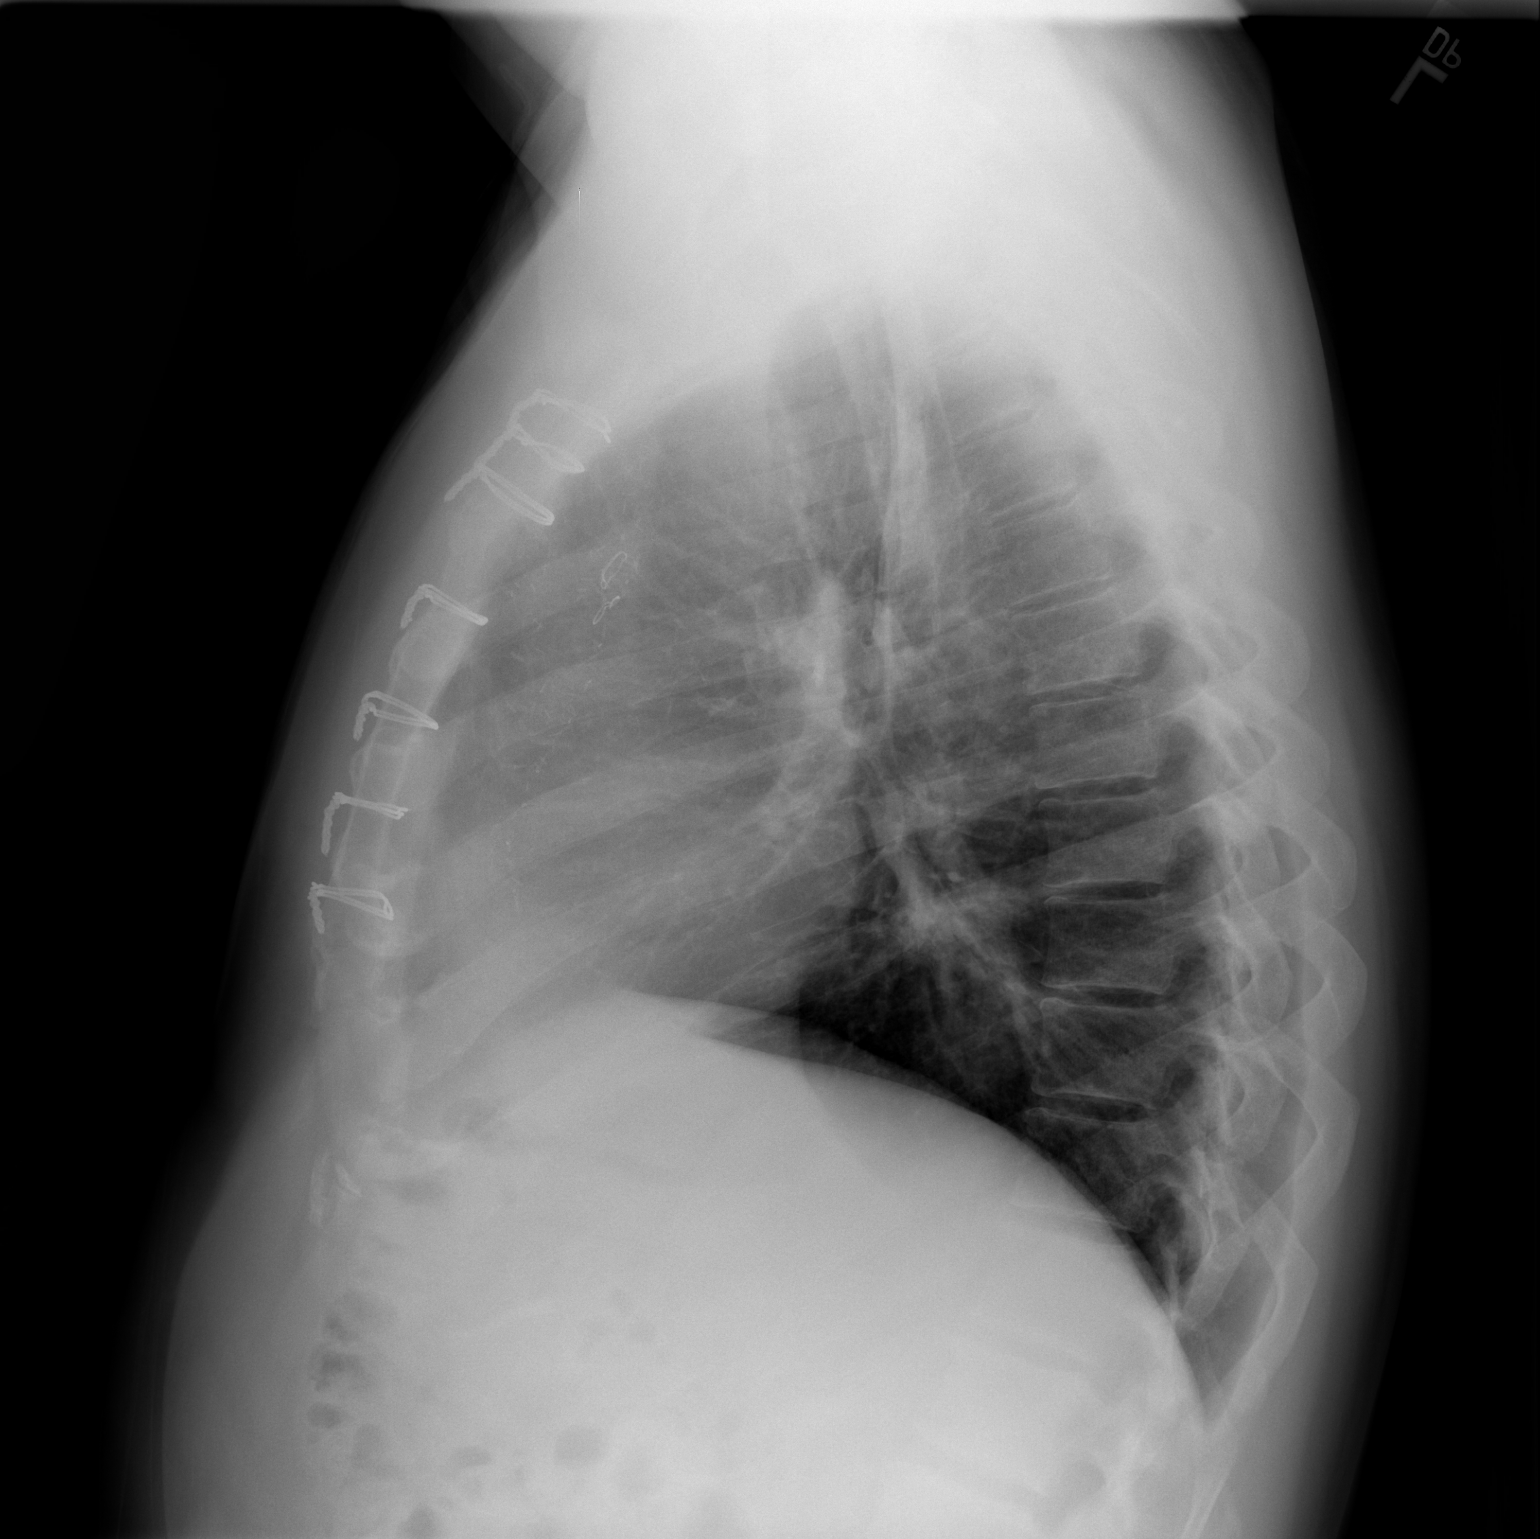

[2 of 2 positions shown; findings below may reference images not displayed]

FINDINGS: The lungs are well-expanded. There is no focal infiltrate. Mildly
increased linear density in the right perihilar region is
demonstrated. The heart and pulmonary vascularity are normal. There
are 7 intact sternal wires.
IMPRESSION: There is no evidence of CHF nor other acute cardiopulmonary
abnormality. Minimal left-sided perihilar subsegmental atelectasis
or scarring persists.

## 2017-06-05 ENCOUNTER — Ambulatory Visit: Payer: Self-pay | Admitting: Gastroenterology

## 2017-06-07 ENCOUNTER — Ambulatory Visit: Payer: Self-pay | Admitting: Gastroenterology

## 2017-06-11 ENCOUNTER — Ambulatory Visit: Payer: Self-pay | Admitting: Gastroenterology

## 2017-11-30 ENCOUNTER — Encounter: Payer: Self-pay | Admitting: Student

## 2017-12-03 ENCOUNTER — Ambulatory Visit: Payer: 59 | Admitting: Anesthesiology

## 2017-12-03 ENCOUNTER — Encounter: Payer: Self-pay | Admitting: *Deleted

## 2017-12-03 ENCOUNTER — Ambulatory Visit
Admission: RE | Admit: 2017-12-03 | Discharge: 2017-12-03 | Disposition: A | Discharge status: Home / Self Care | Payer: 59 | Source: Ambulatory Visit | Attending: Gastroenterology | Admitting: Gastroenterology

## 2017-12-03 ENCOUNTER — Encounter
Admission: RE | Disposition: A | Discharge status: Home / Self Care | Payer: Self-pay | Source: Ambulatory Visit | Attending: Gastroenterology

## 2017-12-03 DIAGNOSIS — Z79899 Other long term (current) drug therapy: Secondary | ICD-10-CM | POA: Diagnosis not present

## 2017-12-03 DIAGNOSIS — N529 Male erectile dysfunction, unspecified: Secondary | ICD-10-CM | POA: Diagnosis not present

## 2017-12-03 DIAGNOSIS — Z951 Presence of aortocoronary bypass graft: Secondary | ICD-10-CM | POA: Diagnosis not present

## 2017-12-03 DIAGNOSIS — F419 Anxiety disorder, unspecified: Secondary | ICD-10-CM | POA: Insufficient documentation

## 2017-12-03 DIAGNOSIS — K5730 Diverticulosis of large intestine without perforation or abscess without bleeding: Secondary | ICD-10-CM | POA: Diagnosis not present

## 2017-12-03 DIAGNOSIS — Z1211 Encounter for screening for malignant neoplasm of colon: Secondary | ICD-10-CM | POA: Insufficient documentation

## 2017-12-03 DIAGNOSIS — K621 Rectal polyp: Secondary | ICD-10-CM | POA: Diagnosis not present

## 2017-12-03 DIAGNOSIS — I2510 Atherosclerotic heart disease of native coronary artery without angina pectoris: Secondary | ICD-10-CM | POA: Insufficient documentation

## 2017-12-03 DIAGNOSIS — E785 Hyperlipidemia, unspecified: Secondary | ICD-10-CM | POA: Insufficient documentation

## 2017-12-03 DIAGNOSIS — R7303 Prediabetes: Secondary | ICD-10-CM | POA: Insufficient documentation

## 2017-12-03 DIAGNOSIS — Z7982 Long term (current) use of aspirin: Secondary | ICD-10-CM | POA: Diagnosis not present

## 2017-12-03 HISTORY — DX: Prediabetes: R73.03

## 2017-12-03 HISTORY — DX: Male erectile dysfunction, unspecified: N52.9

## 2017-12-03 HISTORY — PX: COLONOSCOPY WITH PROPOFOL: SHX5780

## 2017-12-03 HISTORY — DX: Anxiety disorder, unspecified: F41.9

## 2017-12-03 HISTORY — DX: Vitamin D deficiency, unspecified: E55.9

## 2017-12-03 SURGERY — COLONOSCOPY WITH PROPOFOL
Anesthesia: General

## 2017-12-03 MED ORDER — PROPOFOL 500 MG/50ML IV EMUL
INTRAVENOUS | Status: DC | PRN
  Administered 2017-12-03: 140 ug/kg/min via INTRAVENOUS

## 2017-12-03 MED ORDER — LIDOCAINE HCL (PF) 2 % IJ SOLN
INTRAMUSCULAR | Status: AC
  Filled 2017-12-03: qty 10

## 2017-12-03 MED ORDER — SODIUM CHLORIDE 0.9 % IV SOLN
INTRAVENOUS | Status: DC
  Administered 2017-12-03: 14:00:00 via INTRAVENOUS

## 2017-12-03 MED ORDER — MIDAZOLAM HCL 2 MG/2ML IJ SOLN
INTRAMUSCULAR | Status: DC | PRN
  Administered 2017-12-03 (×2): 1 mg via INTRAVENOUS

## 2017-12-03 MED ORDER — PROPOFOL 500 MG/50ML IV EMUL
INTRAVENOUS | Status: AC
  Filled 2017-12-03: qty 50

## 2017-12-03 MED ORDER — PROPOFOL 10 MG/ML IV BOLUS
INTRAVENOUS | Status: AC
  Filled 2017-12-03: qty 20

## 2017-12-03 MED ORDER — MIDAZOLAM HCL 2 MG/2ML IJ SOLN
INTRAMUSCULAR | Status: AC
  Filled 2017-12-03: qty 2

## 2017-12-03 MED ORDER — PROPOFOL 10 MG/ML IV BOLUS
INTRAVENOUS | Status: DC | PRN
  Administered 2017-12-03 (×2): 30 mg via INTRAVENOUS
  Administered 2017-12-03: 40 mg via INTRAVENOUS
  Administered 2017-12-03: 70 mg via INTRAVENOUS
  Administered 2017-12-03: 20 mg via INTRAVENOUS

## 2017-12-03 NOTE — Anesthesia Postprocedure Evaluation (Signed)
Anesthesia Post Note  Patient: Patrick Pope  Procedure(s) Performed: COLONOSCOPY WITH PROPOFOL (N/A )  Patient location during evaluation: PACU Anesthesia Type: General Level of consciousness: awake and alert Pain management: pain level controlled Vital Signs Assessment: post-procedure vital signs reviewed and stable Respiratory status: spontaneous breathing, nonlabored ventilation, respiratory function stable and patient connected to nasal cannula oxygen Cardiovascular status: blood pressure returned to baseline and stable Postop Assessment: no apparent nausea or vomiting Anesthetic complications: no     Last Vitals:  Vitals:   12/03/17 1628 12/03/17 1638  BP:  111/81  Pulse:    Resp: 18 16  Temp:    SpO2:      Last Pain:  Vitals:   12/03/17 1638  TempSrc:   PainSc: 0-No pain                 Molli Barrows

## 2017-12-03 NOTE — H&P (Signed)
Outpatient short stay form Pre-procedure 12/03/2017 3:31 PM Lollie Sails MD  Primary Physician: Threasa Alpha, NP  Reason for visit: Colonoscopy  History of present illness: Patient is a 52 year old male presenting today as above.  He tolerated his prep well.  He does take a 325 mg aspirin that is been held for several days.  He takes no other aspirin or blood thinning agent.  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  Has had some intermittent problems with anal fissures versus hemorrhoids.  There is been no rectal bleeding however.  This is first colonoscopy.    Current Facility-Administered Medications:  .  0.9 %  sodium chloride infusion, , Intravenous, Continuous, Lollie Sails, MD, Last Rate: 20 mL/hr at 12/03/17 1344  Medications Prior to Admission  Medication Sig Dispense Refill Last Dose  . ALPRAZolam (XANAX) 0.5 MG tablet Take 0.5 mg by mouth at bedtime as needed for anxiety.   12/02/2017 at Unknown time  . atorvastatin (LIPITOR) 20 MG tablet Take 20 mg by mouth every evening.   12/03/2017 at 0700  . lisinopril (PRINIVIL,ZESTRIL) 5 MG tablet Take 1 tablet (5 mg total) by mouth daily. 30 tablet 1 12/03/2017 at 0700  . metoprolol tartrate (LOPRESSOR) 25 MG tablet Take 1 tablet (25 mg total) by mouth 2 (two) times daily. 60 tablet 0 12/03/2017 at 0700  . sildenafil (VIAGRA) 100 MG tablet Take 100 mg by mouth daily as needed for erectile dysfunction.   Past Month at Unknown time  . aspirin EC 325 MG EC tablet Take 1 tablet (325 mg total) by mouth daily. (Patient not taking: Reported on 12/03/2017) 30 tablet 0 Not Taking at Unknown time  . HYDROcodone-acetaminophen (NORCO/VICODIN) 5-325 MG per tablet Take 1 tablet by mouth every 6 (six) hours as needed for moderate pain. (Patient not taking: Reported on 12/03/2017) 40 tablet 0 Completed Course at Unknown time     Allergies  Allergen Reactions  . Penicillins      Past Medical History:  Diagnosis Date  . Anxiety   . ED (erectile  dysfunction)   . Hyperlipidemia   . Pre-diabetes   . Vitamin D deficiency     Review of systems:      Physical Exam    Heart and lungs: Regular rate and rhythm without rub or gallop, lungs are bilaterally clear.    HEENT: Normocephalic atraumatic eyes are anicteric    Other:    Pertinant exam for procedure: Soft nontender nondistended bowel sounds positive normoactive.    Planned proceedures:  I have discussed the risks benefits and complications of procedures to include not limited to bleeding, infection, perforation and the risk of sedation and the patient wishes to proceed.Colonoscopy and indicated procedures.    Lollie Sails, MD Gastroenterology 12/03/2017  3:31 PM

## 2017-12-03 NOTE — Op Note (Signed)
[facility] Gastroenterology Patient Name: Patrick Pope Procedure Date: 12/03/2017 3:31 PM MRN: [id number] Account #: [id number] Date of Birth: [birth year] Admit Type: Outpatient Age: 52 Room: [facility] ENDO ROOM 2 Gender: Male Note Status: Finalized Procedure:            Colonoscopy Indications:          Screening for colorectal malignant neoplasm, occasional                        Incidental - Rectal pain Providers:            Lollie Sails, MD Referring MD:         Jordan Likes. Lavena Bullion (Referring MD) Medicines:            Monitored Anesthesia Care Complications:        No immediate complications. Procedure:            Pre-Anesthesia Assessment:                       - ASA Grade Assessment: III - A patient with severe                        systemic disease.                       After obtaining informed consent, the colonoscope was                        passed under direct vision. Throughout the procedure,                        the patient's blood pressure, pulse, and oxygen                        saturations were monitored continuously. The                        Colonoscope was introduced through the anus and                        advanced to the the cecum, identified by appendiceal                        orifice and ileocecal valve. The colonoscopy was                        performed without difficulty. The patient tolerated the                        procedure well. The quality of the bowel preparation                        was good. Findings:      Two sessile polyps were found in the rectum. The polyps were 1 to 2 mm       in size. These polyps were removed with a cold biopsy forceps. Resection       and retrieval were complete.      A few small-mouthed diverticula were found in the sigmoid colon and       descending colon.      The retroflexed view of the distal  rectum and anal verge was normal and       showed no anal or rectal abnormalities.      The  digital rectal exam findings include anal fissure, posterior       midline, mild bleeding with scope passage. Impression:           - Two 1 to 2 mm polyps in the rectum, removed with a                        cold biopsy forceps. Resected and retrieved.                       - Diverticulosis in the sigmoid colon and in the                        descending colon. Recommendation:       - Use Citrucel one tablespoon PO daily daily.                       - Use Analpram HC Cream 2.5%: Apply externally TID for                        10 days.                       - Return to GI clinic in 3 weeks. Procedure Code(s):    --- Professional ---                       [phone], Colonoscopy, flexible; with biopsy, single or                        multiple Diagnosis Code(s):    --- Professional ---                       Z12.11, Encounter for screening for malignant neoplasm                        of colon                       K62.1, Rectal polyp                       K57.30, Diverticulosis of large intestine without                        perforation or abscess without bleeding CPT copyright 2017 American Medical Association. All rights reserved. The codes documented in this report are preliminary and upon coder review may  be revised to meet current compliance requirements. Lollie Sails, MD 12/03/2017 4:12:51 PM This report has been signed electronically. Number of Addenda: 0 Note Initiated On: 12/03/2017 3:31 PM Scope Withdrawal Time: 0 hours 6 minutes 49 seconds  Total Procedure Duration: 0 hours 20 minutes 23 seconds       [facility]

## 2017-12-03 NOTE — Anesthesia Post-op Follow-up Note (Signed)
Anesthesia QCDR form completed.        

## 2017-12-03 NOTE — Anesthesia Preprocedure Evaluation (Signed)
Anesthesia Evaluation  Patient identified by MRN, date of birth, ID band Patient awake    Reviewed: Allergy & Precautions, NPO status , Patient's Chart, lab work & pertinent test results, reviewed documented beta blocker date and time   Airway Mallampati: II  TM Distance: >3 FB     Dental  (+) Chipped   Pulmonary           Cardiovascular + CAD and + CABG       Neuro/Psych Anxiety    GI/Hepatic   Endo/Other    Renal/GU      Musculoskeletal   Abdominal   Peds  Hematology   Anesthesia Other Findings   Reproductive/Obstetrics                             Anesthesia Physical Anesthesia Plan  ASA: III  Anesthesia Plan: General   Post-op Pain Management:    Induction: Intravenous  PONV Risk Score and Plan:   Airway Management Planned:   Additional Equipment:   Intra-op Plan:   Post-operative Plan:   Informed Consent: I have reviewed the patients History and Physical, chart, labs and discussed the procedure including the risks, benefits and alternatives for the proposed anesthesia with the patient or authorized representative who has indicated his/her understanding and acceptance.     Plan Discussed with: CRNA  Anesthesia Plan Comments:         Anesthesia Quick Evaluation

## 2017-12-03 NOTE — Transfer of Care (Signed)
Immediate Anesthesia Transfer of Care Note  Patient: Sircharles Holzheimer  Procedure(s) Performed: COLONOSCOPY WITH PROPOFOL (N/A )  Patient Location: PACU  Anesthesia Type:General  Level of Consciousness: sedated  Airway & Oxygen Therapy: Patient Spontanous Breathing and Patient connected to nasal cannula oxygen  Post-op Assessment: Report given to RN and Post -op Vital signs reviewed and stable  Post vital signs: Reviewed and stable  Last Vitals:  Vitals Value Taken Time  BP 110/66 12/03/2017  4:09 PM  Temp 36.2 C 12/03/2017  4:09 PM  Pulse 63 12/03/2017  4:09 PM  Resp 15 12/03/2017  4:09 PM  SpO2 97 % 12/03/2017  4:09 PM    Last Pain:  Vitals:   12/03/17 1608  TempSrc: Tympanic  PainSc: 0-No pain         Complications: No apparent anesthesia complications

## 2017-12-05 LAB — SURGICAL PATHOLOGY
